# Patient Record
Sex: Female | Born: 1975
Health system: Southern US, Community
[De-identification: ages and names within clinical notes are randomized; demographics above are authoritative.]

## PROBLEM LIST (undated history)

## (undated) DIAGNOSIS — F419 Anxiety disorder, unspecified: Secondary | ICD-10-CM

## (undated) DIAGNOSIS — F32A Depression, unspecified: Secondary | ICD-10-CM

## (undated) HISTORY — PX: ACHILLES TENDON SURGERY: SHX542

## (undated) HISTORY — DX: Depression, unspecified: F32.A

## (undated) HISTORY — DX: Anxiety disorder, unspecified: F41.9

---

## 1999-05-21 ENCOUNTER — Other Ambulatory Visit: Admission: RE | Admit: 1999-05-21 | Discharge: 1999-05-21 | Payer: Self-pay | Admitting: Family Medicine

## 2006-01-22 ENCOUNTER — Inpatient Hospital Stay (HOSPITAL_COMMUNITY): Admission: AD | Admit: 2006-01-22 | Discharge: 2006-01-22 | Payer: Self-pay | Admitting: Obstetrics and Gynecology

## 2006-02-22 ENCOUNTER — Inpatient Hospital Stay (HOSPITAL_COMMUNITY): Admission: AD | Admit: 2006-02-22 | Discharge: 2006-02-25 | Payer: Self-pay | Admitting: *Deleted

## 2013-02-27 ENCOUNTER — Emergency Department: Payer: Self-pay | Admitting: Emergency Medicine

## 2014-05-13 IMAGING — CR DG ELBOW COMPLETE 3+V*L*
1 series · 4 of 4 positions shown · non-contrast
Comparison: None.

CLINICAL DATA: Left elbow injury and pain.

EXAM:
LEFT ELBOW - COMPLETE 3+ VIEW

[Series 1: x elbow lat left · 0.14mm/px · 4 of 4 slices shown]
[im 1/4]
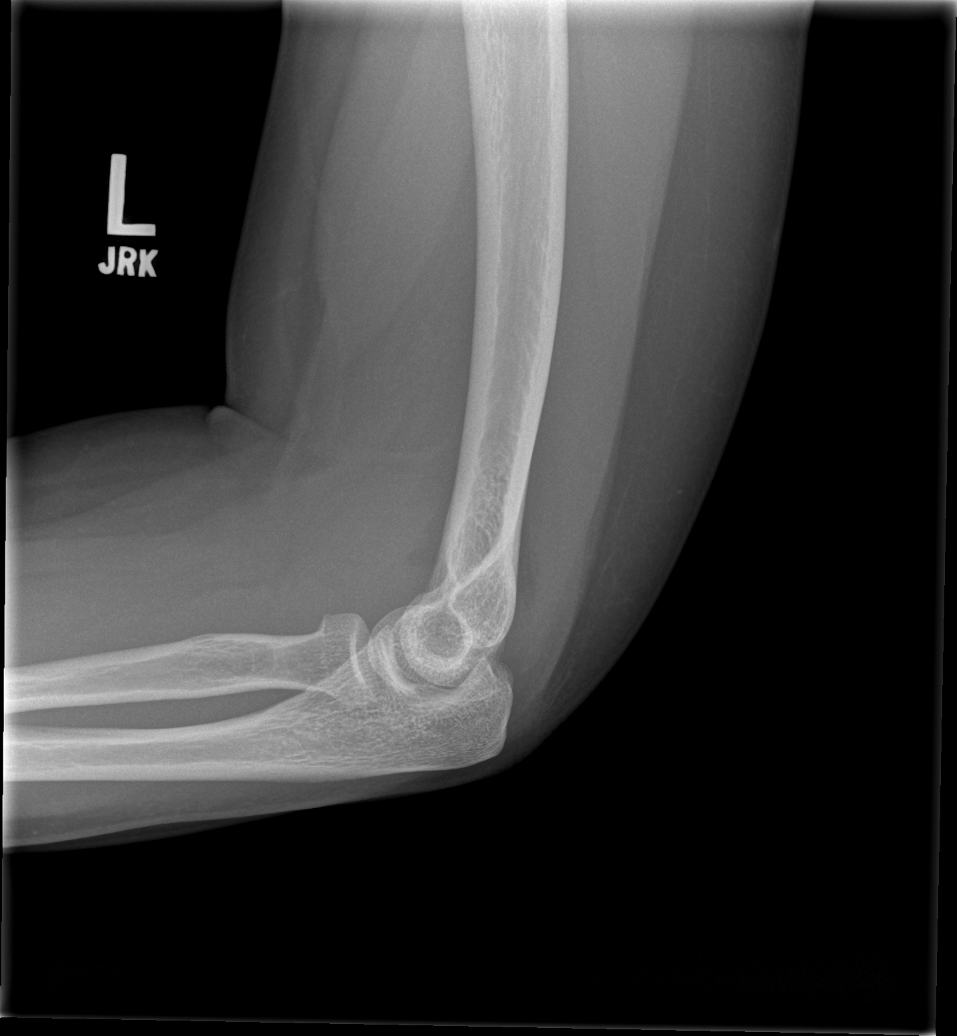
[im 2/4]
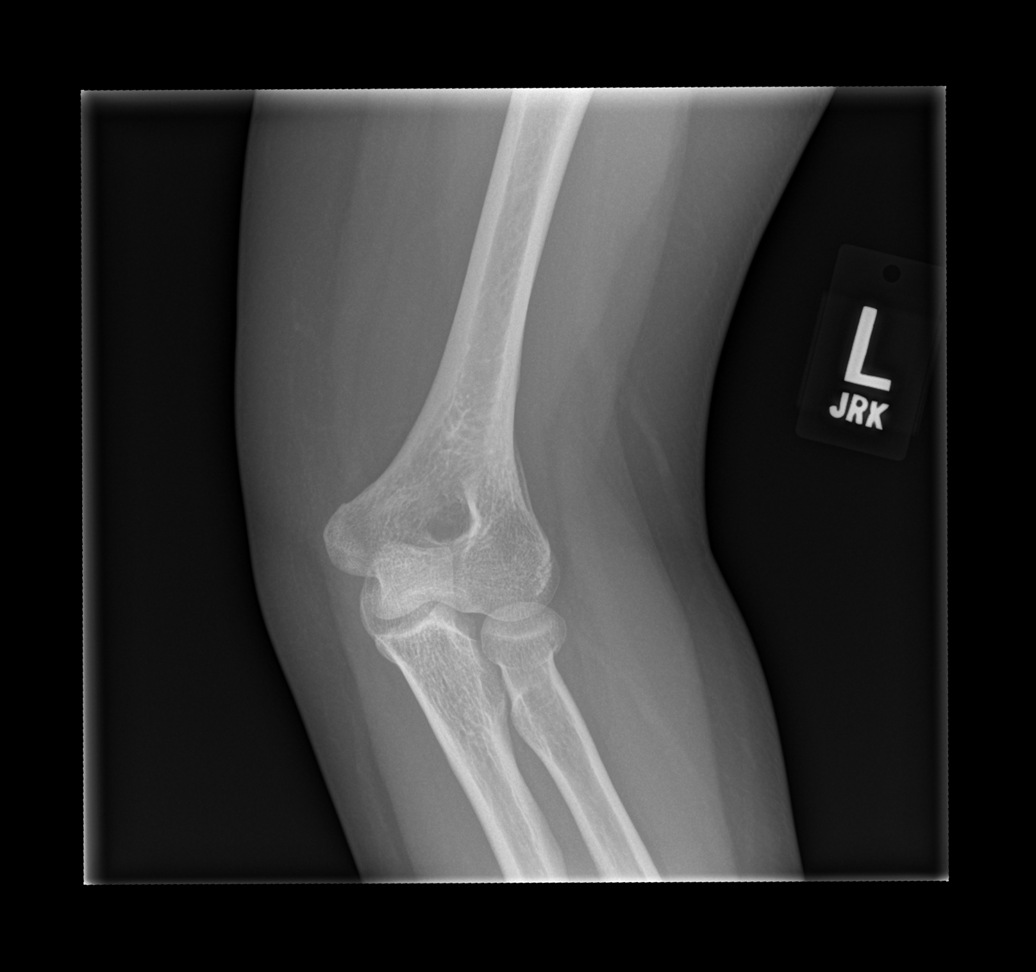
[im 3/4]
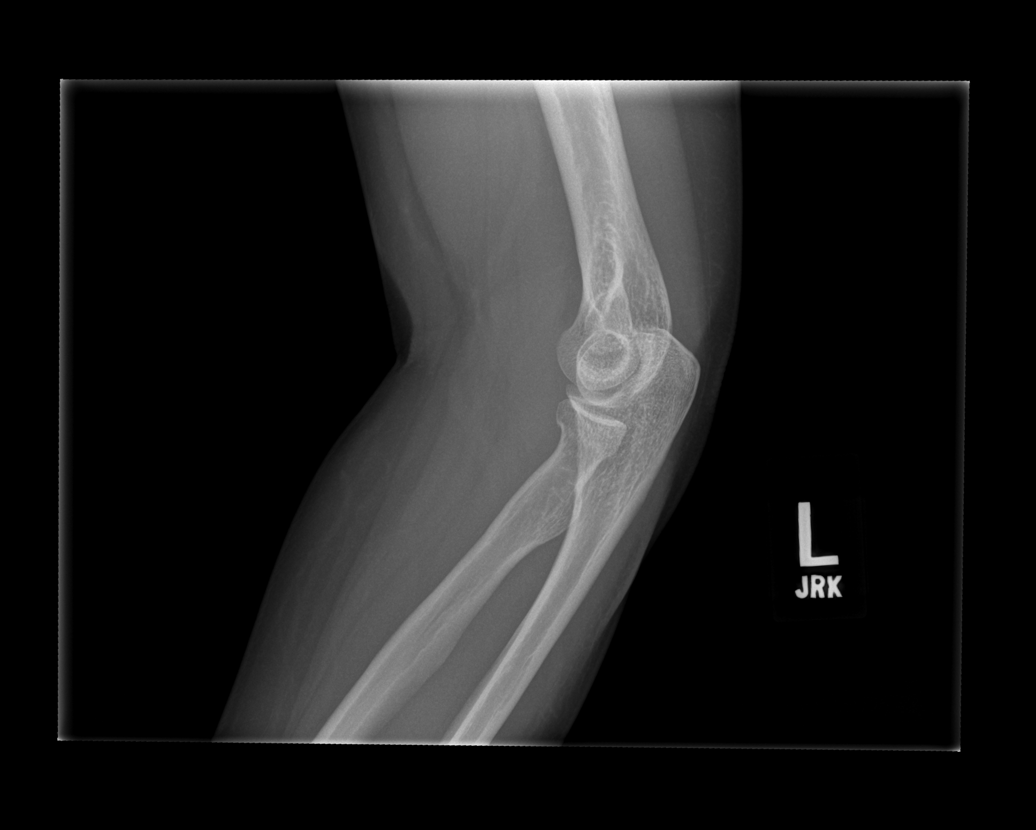
[im 4/4]
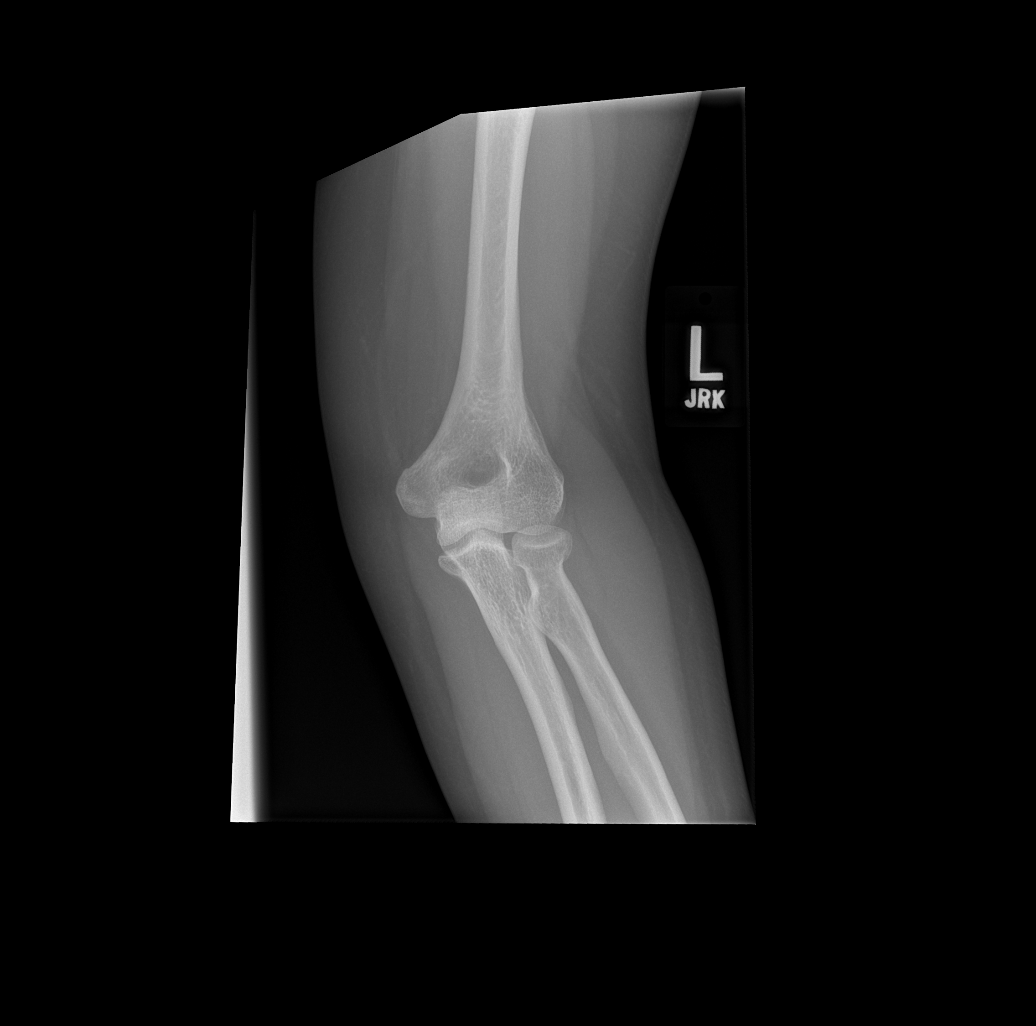

[4 of 4 positions shown; findings below may reference images not displayed]

FINDINGS: A nondisplaced radial head fracture is noted with a joint effusion.

There is no evidence of subluxation or dislocation.

No other bony abnormalities are identified.
IMPRESSION: Nondisplaced radial head fracture with joint effusion.

## 2016-01-26 ENCOUNTER — Ambulatory Visit (INDEPENDENT_AMBULATORY_CARE_PROVIDER_SITE_OTHER): Payer: BLUE CROSS/BLUE SHIELD | Admitting: Sports Medicine

## 2016-01-26 ENCOUNTER — Encounter: Payer: Self-pay | Admitting: Sports Medicine

## 2016-01-26 ENCOUNTER — Ambulatory Visit (INDEPENDENT_AMBULATORY_CARE_PROVIDER_SITE_OTHER): Payer: BLUE CROSS/BLUE SHIELD

## 2016-01-26 DIAGNOSIS — M79671 Pain in right foot: Secondary | ICD-10-CM

## 2016-01-26 DIAGNOSIS — M7661 Achilles tendinitis, right leg: Secondary | ICD-10-CM | POA: Diagnosis not present

## 2016-01-26 MED ORDER — TRIAMCINOLONE ACETONIDE 10 MG/ML IJ SUSP: 10.0000 mg | Freq: Once | INTRAMUSCULAR | Status: AC

## 2016-01-26 MED ORDER — METHYLPREDNISOLONE 4 MG PO TBPK: ORAL_TABLET | ORAL | 0 refills | Status: DC

## 2016-01-26 NOTE — Patient Instructions (Signed)

## 2016-01-26 NOTE — Progress Notes (Signed)
Subjective: Costella HatcherRobin B Quashie is a 40 y.o. female patient who presents to office for evaluation of Right heel pain. Patient complains of progressive pain especially over the last 4 months in the Right foot at the Achilles. Patient has tried change in shoes, icing, and elevating with meloxicam with no complete relief in symptoms. Admits to post static pain especially in the mornings or after a period of sitting and goes to stand. Patient denies any other pedal complaints.   There are no active problems to display for this patient.   No current outpatient prescriptions on file prior to visit.   No current facility-administered medications on file prior to visit.     No Known Allergies  Objective:  General: Alert and oriented x3 in no acute distress  Dermatology: No open lesions bilateral lower extremities, no webspace macerations, no ecchymosis bilateral, all nails x 10 are well manicured.  Vascular: Dorsalis Pedis and Posterior Tibial pedal pulses 2 out of 4, Capillary Fill Time 3 seconds, + pedal hair growth bilateral, no edema bilateral lower extremities, Temperature gradient within normal limits.  Neurology: Michaell CowingGross sensation intact via light touch bilateral, Protective sensation intact with Phoebe PerchSemmes Weinstein Monofilament to all pedal sites, Position sense intact, vibratory intact bilateral, Deep tendon reflexes within normal limits bilateral, No babinski sign present bilateral. No Tinels sign bilateral.  Musculoskeletal: Mild tenderness with palpation at insertion of the Achilles on Right at the medial greater than central and lateral aspect, there is calcaneal exostosis with mild soft tissue present and decreased ankle rom with knee extending  vs flexed resembling gastroc equnius bilateral, The achilles tendon feels intact with no nodularity or palpable dell, Thompson sign negative, Subtalar and midtarsal joint range of motion is within normal limits, there is no 1st ray hypermobility or  forefoot deformity noted bilateral.   Gait: Antalgic gait with increased heel off right>left.   Xrays  Right Foot    Impression: Normal osseous mineralization. Joint spaces preserved. No fracture/dislocation/boney destruction. Calcaneal spur present. Kager's triangle intact with no obliteration. No soft tissue abnormalities or radiopaque foreign bodies.   Assessment and Plan: Problem List Items Addressed This Visit    None    Visit Diagnoses    Tendonitis, Achilles, right    -  Primary   Relevant Medications   methylPREDNISolone (MEDROL DOSEPAK) 4 MG TBPK tablet   triamcinolone acetonide (KENALOG) 10 MG/ML injection 10 mg   Right foot pain       Relevant Medications   methylPREDNISolone (MEDROL DOSEPAK) 4 MG TBPK tablet   Other Relevant Orders   DG Foot 2 Views Right     -Complete examination performed -Xrays reviewed -Discussed treatement optionsFor insertional Achilles tendinitis -After oral consent and aseptic prep, injected a mixture containing 1 ml of 2%  plain lidocaine, 1 ml 0.5% plain marcaine, 0.5 ml of kenalog 10 and 0.5 ml of dexamethasone phosphate into right medial heel at the Achilles insertion with care not to violate tendon without complication. Post-injection care discussed with patient.  -Dispensed heel lifts to protect the tendon to wear at all times when ambulating -Rx Medrol dose pack. Patient is to resume the meloxicam. She has once dose pack is completed. -Recommend gentle stretching as instructed -No improvement will consider MRI/PT/EPAT -Patient to return to office in 3 weeks or sooner if condition worsens.  Asencion Islamitorya Dosia Yodice, DPM

## 2016-02-16 ENCOUNTER — Ambulatory Visit: Payer: BLUE CROSS/BLUE SHIELD | Admitting: Sports Medicine

## 2016-03-08 ENCOUNTER — Ambulatory Visit: Payer: BLUE CROSS/BLUE SHIELD | Admitting: Sports Medicine

## 2016-10-28 ENCOUNTER — Telehealth: Payer: Self-pay | Admitting: *Deleted

## 2016-10-28 NOTE — Telephone Encounter (Signed)
Pt states [place], and would like an earlier appt, because she is in such pain. Routed message to schedulers.

## 2016-10-29 ENCOUNTER — Ambulatory Visit (INDEPENDENT_AMBULATORY_CARE_PROVIDER_SITE_OTHER): Payer: BLUE CROSS/BLUE SHIELD | Admitting: Sports Medicine

## 2016-10-29 ENCOUNTER — Encounter: Payer: Self-pay | Admitting: Sports Medicine

## 2016-10-29 VITALS — BP 134/83 | HR 78 | Resp 16

## 2016-10-29 DIAGNOSIS — M7661 Achilles tendinitis, right leg: Secondary | ICD-10-CM | POA: Diagnosis not present

## 2016-10-29 DIAGNOSIS — M79671 Pain in right foot: Secondary | ICD-10-CM

## 2016-10-29 MED ORDER — TRIAMCINOLONE ACETONIDE 10 MG/ML IJ SUSP: 10.0000 mg | Freq: Once | INTRAMUSCULAR | Status: AC

## 2016-10-29 MED ORDER — METHYLPREDNISOLONE 4 MG PO TBPK: ORAL_TABLET | ORAL | 0 refills | Status: DC

## 2016-10-29 NOTE — Progress Notes (Signed)
Subjective: Mercedes Kline is a 41 y.o. female patient who returns to office for follow-up evaluation of Right heel pain. Patient states that her heel pain has flared up again started about a month ago and states that has gradually gotten worse, has tried icing and elevating, but nothing seems to help and by the end day. Pain is 8 out of 10. Patient denies any other pedal complaints.   There are no active problems to display for this patient.   Current Outpatient Prescriptions on File Prior to Visit  Medication Sig Dispense Refill  . ALPRAZolam (XANAX PO) Take by mouth.    . escitalopram (LEXAPRO) 20 MG tablet Take 20 mg by mouth daily.    . meclizine (ANTIVERT) 25 MG tablet Take 25 mg by mouth 3 (three) times daily as needed for dizziness.    . medroxyPROGESTERone (DEPO-PROVERA) 150 MG/ML injection Inject 150 mg into the muscle every 3 (three) months.    . MedroxyPROGESTERone Acetate (DEPO-PROVERA IM) Inject into the muscle.    . meloxicam (MOBIC) 15 MG tablet Take 15 mg by mouth daily.    . traMADol (ULTRAM) 50 MG tablet Take by mouth every 6 (six) hours as needed.     Current Facility-Administered Medications on File Prior to Visit  Medication Dose Route Frequency Provider Last Rate Last Dose  . triamcinolone acetonide (KENALOG) 10 MG/ML injection 10 mg  10 mg Other Once Asencion IslamStover, Fillmore Bynum, DPM        No Known Allergies  Objective:  General: Alert and oriented x3 in no acute distress  Dermatology: No open lesions bilateral lower extremities, no webspace macerations, no ecchymosis bilateral, all nails x 10 are well manicured.  Vascular: Dorsalis Pedis and Posterior Tibial pedal pulses 2 out of 4, Capillary Fill Time 3 seconds, + pedal hair growth bilateral, no edema bilateral lower extremities, Temperature gradient within normal limits.  Neurology: Michaell CowingGross sensation intact via light touch bilateral, Protective sensation intact with Phoebe PerchSemmes Weinstein Monofilament to all pedal sites,  Position sense intact, vibratory intact bilateral, Deep tendon reflexes within normal limits bilateral, No babinski sign present bilateral. No Tinels sign bilateral.  Musculoskeletal: Mild tenderness with palpation at insertion of the Achilles on Right at the medial greater than central and lateral aspect, there is calcaneal exostosis with mild soft tissue present and decreased ankle rom with knee extending  vs flexed resembling gastroc equnius bilateral, The achilles tendon feels intact with no nodularity or palpable dell, Thompson sign negative, Subtalar and midtarsal joint range of motion is within normal limits, there is no 1st ray hypermobility or forefoot deformity noted bilateral.   Assessment and Plan: Problem List Items Addressed This Visit    None    Visit Diagnoses    Tendonitis, Achilles, right    -  Primary   Relevant Medications   methylPREDNISolone (MEDROL DOSEPAK) 4 MG TBPK tablet   triamcinolone acetonide (KENALOG) 10 MG/ML injection 10 mg   Right foot pain       Relevant Medications   methylPREDNISolone (MEDROL DOSEPAK) 4 MG TBPK tablet   triamcinolone acetonide (KENALOG) 10 MG/ML injection 10 mg     -Complete examination performed -Previous Xrays reviewed -Discussed treatement options for insertional Achilles tendinitis -After oral consent and aseptic prep, injected a mixture containing 1 ml of 2%  plain lidocaine, 1 ml 0.5% plain marcaine, 0.5 ml of kenalog 10 and 0.5 ml of dexamethasone phosphate into right medial heel at the Achilles insertion with care not to violate tendon without complication. Post-injection  care discussed with patient. Dispensed CAM boot for patient to wear for 1 week and asked patient to avoid strenuous activity; after 1 week Patient may return to good supportive tennis shoe with heel lift as dispense this visit -Rx Medrol dose pack. Patient is to resume the meloxicam. She has once dose pack is completed. -Recommend gentle stretching as instructed  with night splint and ice pack at end day -No improvement will consider MRI/PT/EPAT -No work for today. May resume normal work with CAM boot on tomorrow -Patient to return to office in 3 weeks or sooner if condition worsens.  Asencion Islamitorya Alma Muegge, DPM

## 2016-11-08 ENCOUNTER — Ambulatory Visit: Payer: BLUE CROSS/BLUE SHIELD | Admitting: Sports Medicine

## 2016-11-19 ENCOUNTER — Ambulatory Visit: Payer: BLUE CROSS/BLUE SHIELD | Admitting: Sports Medicine

## 2016-12-31 DIAGNOSIS — F419 Anxiety disorder, unspecified: Secondary | ICD-10-CM | POA: Insufficient documentation

## 2017-01-13 ENCOUNTER — Encounter: Payer: Self-pay | Admitting: Emergency Medicine

## 2017-01-13 ENCOUNTER — Emergency Department
Admission: EM | Admit: 2017-01-13 | Discharge: 2017-01-14 | Disposition: A | Discharge status: Home / Self Care | Payer: BLUE CROSS/BLUE SHIELD | Attending: Emergency Medicine | Admitting: Emergency Medicine

## 2017-01-13 DIAGNOSIS — Y939 Activity, unspecified: Secondary | ICD-10-CM | POA: Diagnosis not present

## 2017-01-13 DIAGNOSIS — Y929 Unspecified place or not applicable: Secondary | ICD-10-CM | POA: Diagnosis not present

## 2017-01-13 DIAGNOSIS — Z791 Long term (current) use of non-steroidal anti-inflammatories (NSAID): Secondary | ICD-10-CM | POA: Diagnosis not present

## 2017-01-13 DIAGNOSIS — S99922A Unspecified injury of left foot, initial encounter: Secondary | ICD-10-CM | POA: Diagnosis present

## 2017-01-13 DIAGNOSIS — Z23 Encounter for immunization: Secondary | ICD-10-CM | POA: Diagnosis not present

## 2017-01-13 DIAGNOSIS — W268XXA Contact with other sharp object(s), not elsewhere classified, initial encounter: Secondary | ICD-10-CM | POA: Insufficient documentation

## 2017-01-13 DIAGNOSIS — Z79899 Other long term (current) drug therapy: Secondary | ICD-10-CM | POA: Diagnosis not present

## 2017-01-13 DIAGNOSIS — Y999 Unspecified external cause status: Secondary | ICD-10-CM | POA: Insufficient documentation

## 2017-01-13 DIAGNOSIS — S91115A Laceration without foreign body of left lesser toe(s) without damage to nail, initial encounter: Secondary | ICD-10-CM | POA: Diagnosis not present

## 2017-01-13 NOTE — ED Triage Notes (Signed)
Pt reports she fell coming off of a ladder and lacerated her second toe on left foot. On assessment there is an approximate 1 inch laceration under the 2nd toe. Bleeding controled at this time.

## 2017-01-13 NOTE — ED Provider Notes (Signed)
[facility] Emergency Department Provider Note   ____________________________________________   I have reviewed the triage vital signs and the nursing notes.   HISTORY  Chief Complaint Laceration   History limited by: Not Limited   HPI Mercedes Kline is a 41 y.o. female who presents to the emergency department today because of laceration and pain.   LOCATION:left fourth toe DURATION:happened yesterday evening TIMING: constant since it happened CONTEXT: patient was on a ladder, when she was stepping off she missed a step and it cut her. She was wearing flip flops at the time. Also suffered minor cuts to the third toe.  ASSOCIATED SYMPTOMS: No other injuries  Per medical record review no tdap documented.  History reviewed. No pertinent past medical history.  There are no active problems to display for this patient.   History reviewed. No pertinent surgical history.  Prior to Admission medications   Medication Sig Start Date End Date Taking? Authorizing Provider  ALPRAZolam (XANAX PO) Take by mouth.    [provider]  escitalopram (LEXAPRO) 20 MG tablet Take 20 mg by mouth daily.    [provider]  meclizine (ANTIVERT) 25 MG tablet Take 25 mg by mouth 3 (three) times daily as needed for dizziness.    [provider]  medroxyPROGESTERone (DEPO-PROVERA) 150 MG/ML injection Inject 150 mg into the muscle every 3 (three) months.    [provider]  MedroxyPROGESTERone Acetate (DEPO-PROVERA IM) Inject into the muscle.    [provider]  meloxicam (MOBIC) 15 MG tablet Take 15 mg by mouth daily.    [provider]  methylPREDNISolone (MEDROL DOSEPAK) 4 MG TBPK tablet Take as instructed 10/29/16   Asencion Islam, DPM  traMADol (ULTRAM) 50 MG tablet Take by mouth every 6 (six) hours as needed.    [provider]    Allergies Patient has no known allergies.  History reviewed. No pertinent  family history.  Social History Social History  Substance Use Topics  . Smoking status: Never Smoker  . Smokeless tobacco: Never Used  . Alcohol use Not on file    Review of Systems Constitutional: No fever/chills Eyes: No visual changes. ENT: No sore throat. Cardiovascular: Denies chest pain. Respiratory: Denies shortness of breath. Gastrointestinal: No abdominal pain.  No nausea, no vomiting.  No diarrhea.   Genitourinary: Negative for dysuria. Musculoskeletal: Positive for toe pain. Skin: Positive to laceration to left toe Neurological: Negative for headaches, focal weakness or numbness.  ____________________________________________   PHYSICAL EXAM:  VITAL SIGNS: ED Triage Vitals  Enc Vitals Group     BP 01/13/17 2137 131/77     Pulse Rate 01/13/17 2137 88     Resp 01/13/17 2137 16     Temp 01/13/17 2137 98 F (36.7 C)     Temp Source 01/13/17 2137 Oral     SpO2 01/13/17 2137 98 %     Weight 01/13/17 2124 197 lb (89.4 kg)     Height 01/13/17 2124  (1.6 m)   Constitutional: Alert and oriented. Well appearing and in no distress. Eyes: Conjunctivae are normal.  ENT   Head: Normocephalic and atraumatic.   Nose: No congestion/rhinnorhea.   Mouth/Throat: Mucous membranes are moist.   Neck: No stridor. Hematological/Lymphatic/Immunilogical: No cervical lymphadenopathy. Cardiovascular: Normal rate, regular rhythm.  No murmurs, rubs, or gallops.  Respiratory: Normal respiratory effort without tachypnea nor retractions. Breath sounds are clear and equal bilaterally. No wheezes/rales/rhonchi. Gastrointestinal: Soft and non tender. No rebound. No guarding.  Genitourinary: Deferred Musculoskeletal: Normal range of motion in all extremities. No lower extremity edema. Neurologic:  Normal speech and language. No gross focal neurologic deficits are appreciated.  Skin:  Roughly 1.5 cm laceration to the plantar surface of the left 4th toe. Also very small  superficial laceration to the 3rd toe Psychiatric: Mood and affect are normal. Speech and behavior are normal. Patient exhibits appropriate insight and judgment.  ____________________________________________    LABS (pertinent positives/negatives)  None  ____________________________________________   EKG  None  ____________________________________________    RADIOLOGY  None  ____________________________________________   PROCEDURES  Procedures  LACERATION REPAIR Performed by: Phineas Semen Authorized by: Phineas Semen Consent: Verbal consent obtained. Risks and benefits: risks, benefits and alternatives were discussed Consent given by: patient Patient identity confirmed: provided demographic data Prepped and Draped in normal sterile fashion Wound explored  Laceration Location: left fourth toe  Laceration Length: 1.5 cm  No Foreign Bodies seen or palpated  Anesthesia: local infiltration  Local anesthetic: lidocaine 1% without epinephrine  Anesthetic total: 1 ml  Irrigation method: syringe Amount of cleaning: standard  Skin closure: 5-0 vicryl rapide  Number of sutures: 4  Technique: simple interrupted  Patient tolerance: Patient tolerated the procedure well with no immediate complications.  ____________________________________________   INITIAL IMPRESSION / ASSESSMENT AND PLAN / ED COURSE  Pertinent labs & imaging results that were available during my care of the patient were reviewed by me and considered in my medical decision making (see chart for details).  Patient presents with laceration to base of left fourth toe. Concern for osseous injury. X-ray however did not show any osseous involvement. Wound was repaired. Discussed return precautions with the patient. Tetanus was updated.  ____________________________________________   FINAL CLINICAL IMPRESSION(S) / ED DIAGNOSES  Final diagnoses:  Laceration of lesser toe of left foot  without foreign body present or damage to nail, initial encounter     Note: This dictation was prepared with Dragon dictation. Any transcriptional errors that result from this process are unintentional     Phineas Semen, MD 01/14/17 [phone]

## 2017-01-14 ENCOUNTER — Emergency Department: Payer: BLUE CROSS/BLUE SHIELD

## 2017-01-14 MED ORDER — LIDOCAINE HCL (PF) 1 % IJ SOLN
5.0000 mL | Freq: Once | INTRAMUSCULAR | Status: AC
  Administered 2017-01-14: 5 mL
  Filled 2017-01-14: qty 5

## 2017-01-14 MED ORDER — TETANUS-DIPHTH-ACELL PERTUSSIS 5-2.5-18.5 LF-MCG/0.5 IM SUSP
0.5000 mL | Freq: Once | INTRAMUSCULAR | Status: AC
  Administered 2017-01-14: 0.5 mL via INTRAMUSCULAR
  Filled 2017-01-14: qty 0.5

## 2017-01-14 NOTE — Discharge Instructions (Signed)
Please seek medical attention for any high fevers, chest pain, shortness of breath, change in behavior, persistent vomiting, bloody stool or any other new or concerning symptoms.  

## 2017-02-18 ENCOUNTER — Ambulatory Visit: Payer: BLUE CROSS/BLUE SHIELD | Admitting: Sports Medicine

## 2017-02-25 ENCOUNTER — Ambulatory Visit: Payer: BLUE CROSS/BLUE SHIELD | Admitting: Sports Medicine

## 2017-04-16 ENCOUNTER — Encounter: Payer: Self-pay | Admitting: Gastroenterology

## 2017-04-16 ENCOUNTER — Other Ambulatory Visit: Payer: Self-pay | Admitting: Physician Assistant

## 2017-04-16 DIAGNOSIS — Z139 Encounter for screening, unspecified: Secondary | ICD-10-CM

## 2017-05-02 DIAGNOSIS — R52 Pain, unspecified: Secondary | ICD-10-CM | POA: Diagnosis not present

## 2017-05-02 DIAGNOSIS — Z112 Encounter for screening for other bacterial diseases: Secondary | ICD-10-CM | POA: Diagnosis not present

## 2017-05-02 DIAGNOSIS — J3489 Other specified disorders of nose and nasal sinuses: Secondary | ICD-10-CM | POA: Diagnosis not present

## 2017-05-08 DIAGNOSIS — Z3009 Encounter for other general counseling and advice on contraception: Secondary | ICD-10-CM | POA: Diagnosis not present

## 2017-05-08 DIAGNOSIS — E669 Obesity, unspecified: Secondary | ICD-10-CM | POA: Diagnosis not present

## 2017-05-08 DIAGNOSIS — Z713 Dietary counseling and surveillance: Secondary | ICD-10-CM | POA: Diagnosis not present

## 2017-05-08 DIAGNOSIS — Z309 Encounter for contraceptive management, unspecified: Secondary | ICD-10-CM | POA: Diagnosis not present

## 2017-05-08 DIAGNOSIS — Z79899 Other long term (current) drug therapy: Secondary | ICD-10-CM | POA: Diagnosis not present

## 2017-05-08 DIAGNOSIS — Z6835 Body mass index (BMI) 35.0-35.9, adult: Secondary | ICD-10-CM | POA: Diagnosis not present

## 2017-05-08 DIAGNOSIS — F419 Anxiety disorder, unspecified: Secondary | ICD-10-CM | POA: Diagnosis not present

## 2017-05-22 ENCOUNTER — Encounter: Payer: Self-pay | Admitting: *Deleted

## 2017-05-26 ENCOUNTER — Ambulatory Visit: Payer: BLUE CROSS/BLUE SHIELD | Admitting: Gastroenterology

## 2017-05-26 ENCOUNTER — Ambulatory Visit: Payer: BLUE CROSS/BLUE SHIELD

## 2017-06-13 DIAGNOSIS — Z713 Dietary counseling and surveillance: Secondary | ICD-10-CM | POA: Diagnosis not present

## 2017-06-13 DIAGNOSIS — R030 Elevated blood-pressure reading, without diagnosis of hypertension: Secondary | ICD-10-CM | POA: Diagnosis not present

## 2017-06-26 DIAGNOSIS — R52 Pain, unspecified: Secondary | ICD-10-CM | POA: Diagnosis not present

## 2017-06-26 DIAGNOSIS — J020 Streptococcal pharyngitis: Secondary | ICD-10-CM | POA: Diagnosis not present

## 2017-06-26 DIAGNOSIS — J3489 Other specified disorders of nose and nasal sinuses: Secondary | ICD-10-CM | POA: Diagnosis not present

## 2017-07-24 DIAGNOSIS — Z309 Encounter for contraceptive management, unspecified: Secondary | ICD-10-CM | POA: Diagnosis not present

## 2017-08-14 ENCOUNTER — Encounter: Payer: Self-pay | Admitting: Sports Medicine

## 2017-08-14 ENCOUNTER — Ambulatory Visit (INDEPENDENT_AMBULATORY_CARE_PROVIDER_SITE_OTHER): Payer: BLUE CROSS/BLUE SHIELD

## 2017-08-14 ENCOUNTER — Other Ambulatory Visit: Payer: Self-pay | Admitting: Sports Medicine

## 2017-08-14 ENCOUNTER — Encounter: Payer: Self-pay | Admitting: *Deleted

## 2017-08-14 ENCOUNTER — Ambulatory Visit: Payer: BLUE CROSS/BLUE SHIELD | Admitting: Sports Medicine

## 2017-08-14 DIAGNOSIS — M7661 Achilles tendinitis, right leg: Secondary | ICD-10-CM

## 2017-08-14 DIAGNOSIS — M79671 Pain in right foot: Secondary | ICD-10-CM

## 2017-08-14 DIAGNOSIS — M7662 Achilles tendinitis, left leg: Secondary | ICD-10-CM

## 2017-08-14 DIAGNOSIS — M79672 Pain in left foot: Secondary | ICD-10-CM

## 2017-08-14 DIAGNOSIS — M7730 Calcaneal spur, unspecified foot: Secondary | ICD-10-CM

## 2017-08-14 MED ORDER — TRIAMCINOLONE ACETONIDE 10 MG/ML IJ SUSP: 10.0000 mg | Freq: Once | INTRAMUSCULAR | Status: AC

## 2017-08-14 MED ORDER — METHYLPREDNISOLONE 4 MG PO TBPK: ORAL_TABLET | ORAL | 0 refills | Status: DC

## 2017-08-14 MED ORDER — MELOXICAM 15 MG PO TABS: 15.0000 mg | ORAL_TABLET | Freq: Every day | ORAL | 0 refills | Status: DC

## 2017-08-14 NOTE — Progress Notes (Signed)
Subjective: Mercedes Kline is a 42 y.o. female patient who returns to office for new onset left heel pain and continued right heel pain that has flared up over the last month. Patient states that she has started an exercise routine that has started to cause her pain at the back of both heels.  Reports that she started using her night splint again which helps some and is using heel lifts which also help.  Patient states that when her flareup days were very bad on the left side she actually used her cam boot with some improvement.  Reports pain 8 out of 10 sharp in nature and has also tried Tylenol with a little relief.  Patient denies increase warmth, redness, clicking, popping, grinding or pulling sensations to the posterior aspect of the heels.  Patient denies any recent injury or trauma.  Patient denies any other pedal complaints.   There are no active problems to display for this patient.   Current Outpatient Medications on File Prior to Visit  Medication Sig Dispense Refill  . escitalopram (LEXAPRO) 20 MG tablet Take 20 mg by mouth daily.    . medroxyPROGESTERone (DEPO-PROVERA) 150 MG/ML injection Inject 150 mg into the muscle every 3 (three) months.     Current Facility-Administered Medications on File Prior to Visit  Medication Dose Route Frequency Provider Last Rate Last Dose  . triamcinolone acetonide (KENALOG) 10 MG/ML injection 10 mg  10 mg Other Once Asencion Islam, DPM      . triamcinolone acetonide (KENALOG) 10 MG/ML injection 10 mg  10 mg Other Once Asencion Islam, DPM        No Known Allergies  Objective:  General: Alert and oriented x3 in no acute distress  Dermatology: No open lesions bilateral lower extremities, no webspace macerations, no ecchymosis bilateral, all nails x 10 are well manicured.  Vascular: Dorsalis Pedis and Posterior Tibial pedal pulses 2 out of 4, Capillary Fill Time 3 seconds, + pedal hair growth bilateral, no edema bilateral lower extremities,  Temperature gradient within normal limits.  Neurology: Michaell Cowing sensation intact via light touch bilateral, Protective sensation intact with Phoebe Perch Monofilament to all pedal sites, Position sense intact, vibratory intact bilateral, Deep tendon reflexes within normal limits bilateral, No babinski sign present bilateral. No Tinels sign bilateral.  Musculoskeletal: Moderate tenderness with palpation at the insertion of the Achilles on the left and mild tenderness with palpation at insertion of the Achilles on Right at the medial greater than central and lateral aspect, there is calcaneal exostosis with mild soft tissue present and decreased ankle rom with knee extending  vs flexed resembling gastroc equnius bilateral, The achilles tendon feels intact with no nodularity or palpable dell, Thompson sign negative, Subtalar and midtarsal joint range of motion is within normal limits, there is no 1st ray hypermobility or forefoot deformity noted bilateral.   Assessment and Plan: Problem List Items Addressed This Visit    None    Visit Diagnoses    Achilles tendinitis, right leg    -  Primary   Relevant Medications   methylPREDNISolone (MEDROL DOSEPAK) 4 MG TBPK tablet   meloxicam (MOBIC) 15 MG tablet   triamcinolone acetonide (KENALOG) 10 MG/ML injection 10 mg (Start on 08/14/2017 10:15 AM)   Achilles tendinitis, left leg       Relevant Medications   methylPREDNISolone (MEDROL DOSEPAK) 4 MG TBPK tablet   meloxicam (MOBIC) 15 MG tablet   Other Relevant Orders   DG Foot Complete Right   DG  Foot Complete Left   Calcaneal spur, unspecified laterality       Heel pain, bilateral          -Complete examination performed -X-rays were obtained this visit revealing posterior heel spur with soft tissue inflammation at the posterior aspect of the heel with Kagers triangle intact no other acute findings. -Discussed treatement options for reoccurring insertional Achilles tendinitis now bilateral -After  oral consent and aseptic prep, injected a mixture containing 1 ml of 2%  plain lidocaine, 1 ml 0.5% plain marcaine, 0.5 ml of kenalog 10 and 0.5 ml of dexamethasone phosphate into right medial heel at the Achilles insertion with care not to violate tendon without complication. Post-injection care discussed with patient. -Dispensed heel lift to use to protect the Achilles tendon and advised to avoid strenuous activity postinjection -Rx Medrol dose pack and meloxicam to take as instructed -Recommend gentle stretching as instructed with night splint and ice pack at end day as previous -No improvement will consider MRI/PT/EPAT; brochure for EPAT given -No work for today. May resume normal work with heel lifts or CAM boot on tomorrow -Patient to return to office in 3-4 weeks or sooner if condition worsens.  Asencion Islam, DPM

## 2017-08-14 NOTE — Patient Instructions (Signed)

## 2017-09-17 ENCOUNTER — Ambulatory Visit: Payer: BLUE CROSS/BLUE SHIELD | Admitting: Sports Medicine

## 2017-09-26 ENCOUNTER — Encounter: Payer: Self-pay | Admitting: *Deleted

## 2017-09-26 ENCOUNTER — Ambulatory Visit: Payer: BLUE CROSS/BLUE SHIELD | Admitting: Sports Medicine

## 2017-09-26 ENCOUNTER — Encounter: Payer: Self-pay | Admitting: Sports Medicine

## 2017-09-26 DIAGNOSIS — M7662 Achilles tendinitis, left leg: Secondary | ICD-10-CM

## 2017-09-26 DIAGNOSIS — M7661 Achilles tendinitis, right leg: Secondary | ICD-10-CM

## 2017-09-26 DIAGNOSIS — M79672 Pain in left foot: Secondary | ICD-10-CM

## 2017-09-26 DIAGNOSIS — M7730 Calcaneal spur, unspecified foot: Secondary | ICD-10-CM

## 2017-09-26 DIAGNOSIS — M79671 Pain in right foot: Secondary | ICD-10-CM

## 2017-09-26 MED ORDER — MELOXICAM 15 MG PO TABS: 15.0000 mg | ORAL_TABLET | Freq: Every day | ORAL | 0 refills | Status: DC

## 2017-09-26 MED ORDER — TRIAMCINOLONE ACETONIDE 10 MG/ML IJ SUSP: 10.0000 mg | Freq: Once | INTRAMUSCULAR | Status: AC

## 2017-09-26 NOTE — Progress Notes (Signed)
Subjective: Mercedes Kline is a 42 y.o. female patient who returns to office f follow-up evaluation of bilateral heel pain.  Patient reports that the right is doing fine but the left seems about the same or may be a little worse states that on yesterday she had a flareup where she could not put her foot down and reports that it has improved today because she wore her Cam boot and she did her night splint.  Patient denies calf pain warmth redness swelling shortness of breath or chest pain.  There are no active problems to display for this patient.   Current Outpatient Medications on File Prior to Visit  Medication Sig Dispense Refill  . escitalopram (LEXAPRO) 20 MG tablet Take 20 mg by mouth daily.    . medroxyPROGESTERone (DEPO-PROVERA) 150 MG/ML injection Inject 150 mg into the muscle every 3 (three) months.    . meloxicam (MOBIC) 15 MG tablet Take 1 tablet (15 mg total) by mouth daily. 30 tablet 0  . methylPREDNISolone (MEDROL DOSEPAK) 4 MG TBPK tablet Take as instructed 21 tablet 0   Current Facility-Administered Medications on File Prior to Visit  Medication Dose Route Frequency Provider Last Rate Last Dose  . triamcinolone acetonide (KENALOG) 10 MG/ML injection 10 mg  10 mg Other Once Asencion IslamStover, Jahrel Borthwick, DPM      . triamcinolone acetonide (KENALOG) 10 MG/ML injection 10 mg  10 mg Other Once ParksStover, Hollace Michelli, DPM      . triamcinolone acetonide (KENALOG) 10 MG/ML injection 10 mg  10 mg Other Once Asencion IslamStover, Abbygale Lapid, DPM        No Known Allergies  Objective:  General: Alert and oriented x3 in no acute distress  Dermatology: No open lesions bilateral lower extremities, no webspace macerations, no ecchymosis bilateral, all nails x 10 are well manicured.  Vascular: Dorsalis Pedis and Posterior Tibial pedal pulses 2 out of 4, Capillary Fill Time 3 seconds, + pedal hair growth bilateral, no edema bilateral lower extremities, Temperature gradient within normal limits.  Neurology: Michaell CowingGross sensation  intact via light touch bilateral, Protective sensation intact with Semmes Weinstein Monofilament to all pedal sites, Position sense intact, vibratory intact bilateral, Deep tendon reflexes within normal limits bilateral, No babinski sign present bilateral. No Tinels sign bilateral.  Musculoskeletal: Moderate tenderness with palpation at the insertion of the Achilles on the left at lateral aspect, there is minimal pain to palpation of the Achilles insertion on right there is calcaneal exostosis with mild soft tissue present and decreased ankle rom with knee extending  vs flexed resembling gastroc equnius bilateral, The achilles tendon feels intact with no nodularity or palpable dell, Thompson sign negative, Subtalar and midtarsal joint range of motion is within normal limits, there is no 1st ray hypermobility or forefoot deformity noted bilateral.   Assessment and Plan: Problem List Items Addressed This Visit    None    Visit Diagnoses    Achilles tendinitis, left leg    -  Primary   Calcaneal spur, unspecified laterality       Achilles tendinitis, right leg       Heel pain, bilateral         -Complete examination performed -Discussed treatement options for ongoing insertional Achilles tendinitis/heel pain -After oral consent and aseptic prep, injected a mixture containing 1 ml of 2%  plain lidocaine, 1 ml 0.5% plain marcaine, 0.5 ml of kenalog 10 and 0.5 ml of dexamethasone phosphate into left lateral heel at the Achilles insertion with care not to violate  tendon without complication. Post-injection care discussed with patient. -Dispensed heel lifts bilateral to use to protect the Achilles tendon and advised to avoid strenuous activity postinjection -Refilled meloxicam to take as instructed -Recommend gentle stretching as instructed with night splint and ice pack at end day as previous -Prescribed physical therapy for additional treatment modalities and provided a work note to allow  accommodation with shoes for her foot pain -Patient to return to office in 4-6 weeks after starting physical therapy or sooner if condition worsens.  Asencion Islam, DPM

## 2017-09-30 DIAGNOSIS — M25671 Stiffness of right ankle, not elsewhere classified: Secondary | ICD-10-CM | POA: Diagnosis not present

## 2017-09-30 DIAGNOSIS — M25572 Pain in left ankle and joints of left foot: Secondary | ICD-10-CM | POA: Diagnosis not present

## 2017-09-30 DIAGNOSIS — M25571 Pain in right ankle and joints of right foot: Secondary | ICD-10-CM | POA: Diagnosis not present

## 2017-09-30 DIAGNOSIS — M25672 Stiffness of left ankle, not elsewhere classified: Secondary | ICD-10-CM | POA: Diagnosis not present

## 2017-10-03 DIAGNOSIS — M25672 Stiffness of left ankle, not elsewhere classified: Secondary | ICD-10-CM | POA: Diagnosis not present

## 2017-10-03 DIAGNOSIS — M25572 Pain in left ankle and joints of left foot: Secondary | ICD-10-CM | POA: Diagnosis not present

## 2017-10-03 DIAGNOSIS — M25571 Pain in right ankle and joints of right foot: Secondary | ICD-10-CM | POA: Diagnosis not present

## 2017-10-03 DIAGNOSIS — M25671 Stiffness of right ankle, not elsewhere classified: Secondary | ICD-10-CM | POA: Diagnosis not present

## 2017-10-08 DIAGNOSIS — M25671 Stiffness of right ankle, not elsewhere classified: Secondary | ICD-10-CM | POA: Diagnosis not present

## 2017-10-08 DIAGNOSIS — M25572 Pain in left ankle and joints of left foot: Secondary | ICD-10-CM | POA: Diagnosis not present

## 2017-10-08 DIAGNOSIS — M25571 Pain in right ankle and joints of right foot: Secondary | ICD-10-CM | POA: Diagnosis not present

## 2017-10-08 DIAGNOSIS — M25672 Stiffness of left ankle, not elsewhere classified: Secondary | ICD-10-CM | POA: Diagnosis not present

## 2017-10-10 DIAGNOSIS — Z309 Encounter for contraceptive management, unspecified: Secondary | ICD-10-CM | POA: Diagnosis not present

## 2017-10-10 DIAGNOSIS — Z3009 Encounter for other general counseling and advice on contraception: Secondary | ICD-10-CM | POA: Diagnosis not present

## 2017-10-13 DIAGNOSIS — M25672 Stiffness of left ankle, not elsewhere classified: Secondary | ICD-10-CM | POA: Diagnosis not present

## 2017-10-13 DIAGNOSIS — M25572 Pain in left ankle and joints of left foot: Secondary | ICD-10-CM | POA: Diagnosis not present

## 2017-10-13 DIAGNOSIS — M25671 Stiffness of right ankle, not elsewhere classified: Secondary | ICD-10-CM | POA: Diagnosis not present

## 2017-10-13 DIAGNOSIS — M25571 Pain in right ankle and joints of right foot: Secondary | ICD-10-CM | POA: Diagnosis not present

## 2017-10-22 DIAGNOSIS — M25572 Pain in left ankle and joints of left foot: Secondary | ICD-10-CM | POA: Diagnosis not present

## 2017-10-22 DIAGNOSIS — M25571 Pain in right ankle and joints of right foot: Secondary | ICD-10-CM | POA: Diagnosis not present

## 2017-10-22 DIAGNOSIS — M25672 Stiffness of left ankle, not elsewhere classified: Secondary | ICD-10-CM | POA: Diagnosis not present

## 2017-10-22 DIAGNOSIS — M25671 Stiffness of right ankle, not elsewhere classified: Secondary | ICD-10-CM | POA: Diagnosis not present

## 2017-10-27 DIAGNOSIS — M25571 Pain in right ankle and joints of right foot: Secondary | ICD-10-CM | POA: Diagnosis not present

## 2017-10-27 DIAGNOSIS — M25572 Pain in left ankle and joints of left foot: Secondary | ICD-10-CM | POA: Diagnosis not present

## 2017-10-27 DIAGNOSIS — M25671 Stiffness of right ankle, not elsewhere classified: Secondary | ICD-10-CM | POA: Diagnosis not present

## 2017-10-27 DIAGNOSIS — M25672 Stiffness of left ankle, not elsewhere classified: Secondary | ICD-10-CM | POA: Diagnosis not present

## 2017-11-07 ENCOUNTER — Telehealth: Payer: Self-pay | Admitting: *Deleted

## 2017-11-07 ENCOUNTER — Ambulatory Visit (INDEPENDENT_AMBULATORY_CARE_PROVIDER_SITE_OTHER): Payer: BLUE CROSS/BLUE SHIELD | Admitting: Sports Medicine

## 2017-11-07 ENCOUNTER — Encounter: Payer: Self-pay | Admitting: Sports Medicine

## 2017-11-07 ENCOUNTER — Encounter: Payer: Self-pay | Admitting: *Deleted

## 2017-11-07 DIAGNOSIS — M7661 Achilles tendinitis, right leg: Secondary | ICD-10-CM

## 2017-11-07 DIAGNOSIS — M7730 Calcaneal spur, unspecified foot: Secondary | ICD-10-CM

## 2017-11-07 DIAGNOSIS — M79672 Pain in left foot: Secondary | ICD-10-CM

## 2017-11-07 DIAGNOSIS — M79671 Pain in right foot: Secondary | ICD-10-CM

## 2017-11-07 DIAGNOSIS — M7662 Achilles tendinitis, left leg: Secondary | ICD-10-CM

## 2017-11-07 MED ORDER — TRIAMCINOLONE ACETONIDE 10 MG/ML IJ SUSP: 10.0000 mg | Freq: Once | INTRAMUSCULAR | Status: AC

## 2017-11-07 MED ORDER — MELOXICAM 15 MG PO TABS: 15.0000 mg | ORAL_TABLET | Freq: Every day | ORAL | 0 refills | Status: DC

## 2017-11-07 NOTE — Addendum Note (Signed)
Addended by: Alphia Kava'CONNELL, VALERY D on: 11/07/2017 03:06 PM   Modules accepted: Orders

## 2017-11-07 NOTE — Progress Notes (Signed)
Subjective: Mercedes HatcherRobin B Kline is a 42 y.o. female patient who returns to office for follow-up evaluation of bilateral heel pain.  Patient reports that she has completed therapy and that it did help however still has flareups of pain states that in the afternoon when she is done with her day sometimes her heels are so painful that she is tearing up because of the pain.  Patient reports that her left heel hurts just as much as her right heel now states that it is hard for her to walk she even has to do stretches anytime that she has sat down and has continued with her heel lifts night splint and completed her anti-inflammatory with no complete relief.  Patient states that she wants to consider surgery for her Achilles tendinitis and heel spurs.  Patient denies redness, warmth, pain in her calf states that pain is all at the back of her heels at the bone spurs.  Patient denies any other symptoms at this time.  There are no active problems to display for this patient.   Current Outpatient Medications on File Prior to Visit  Medication Sig Dispense Refill  . escitalopram (LEXAPRO) 20 MG tablet Take 20 mg by mouth daily.    . medroxyPROGESTERone (DEPO-PROVERA) 150 MG/ML injection Inject 150 mg into the muscle every 3 (three) months.    . meloxicam (MOBIC) 15 MG tablet Take 1 tablet (15 mg total) by mouth daily. 30 tablet 0  . methylPREDNISolone (MEDROL DOSEPAK) 4 MG TBPK tablet Take as instructed 21 tablet 0   Current Facility-Administered Medications on File Prior to Visit  Medication Dose Route Frequency Provider Last Rate Last Dose  . triamcinolone acetonide (KENALOG) 10 MG/ML injection 10 mg  10 mg Other Once Asencion IslamStover, Anthonyjames Bargar, DPM      . triamcinolone acetonide (KENALOG) 10 MG/ML injection 10 mg  10 mg Other Once Asencion IslamStover, Nomi Rudnicki, DPM      . triamcinolone acetonide (KENALOG) 10 MG/ML injection 10 mg  10 mg Other Once BereaStover, Oather Muilenburg, DPM      . triamcinolone acetonide (KENALOG) 10 MG/ML injection 10 mg   10 mg Other Once Asencion IslamStover, Ramata Strothman, DPM        No Known Allergies  Objective:  General: Alert and oriented x3 in no acute distress  Dermatology: No open lesions bilateral lower extremities, no webspace macerations, no ecchymosis bilateral, all nails x 10 are well manicured.  Vascular: Dorsalis Pedis and Posterior Tibial pedal pulses 2 out of 4, Capillary Fill Time 3 seconds, + pedal hair growth bilateral, no edema bilateral lower extremities, Temperature gradient within normal limits.  Neurology: Michaell CowingGross sensation intact via light touch bilateral, Protective sensation intact with Phoebe PerchSemmes Weinstein Monofilament to all pedal sites, Position sense intact, vibratory intact bilateral, Deep tendon reflexes within normal limits bilateral, No babinski sign present bilateral. No Tinels sign bilateral.  Musculoskeletal: There is tenderness with palpation at the insertion of the Achilles on the left and right at lateral aspect,there is calcaneal exostosis with mild soft tissue present and decreased ankle rom with knee extending  vs flexed resembling gastroc equnius bilateral, The achilles tendon feels intact with no nodularity or palpable dell, Thompson sign negative, Subtalar and midtarsal joint range of motion is within normal limits, there is no 1st ray hypermobility or forefoot deformity noted bilateral.   Assessment and Plan: Problem List Items Addressed This Visit    None    Visit Diagnoses    Achilles tendinitis, left leg    -  Primary  Relevant Medications   meloxicam (MOBIC) 15 MG tablet   triamcinolone acetonide (KENALOG) 10 MG/ML injection 10 mg (Start on 11/07/2017  9:30 AM)   Achilles tendinitis, right leg       Relevant Medications   meloxicam (MOBIC) 15 MG tablet   triamcinolone acetonide (KENALOG) 10 MG/ML injection 10 mg (Start on 11/07/2017  9:30 AM)   Heel pain, bilateral       Calcaneal spur, unspecified laterality         -Complete examination performed -Discussed treatement  options for ongoing insertional Achilles tendinitis/heel pain -Continue with exercises taught by PT -Continue with night splint icing and refill meloxicam -Ordered MRIs for further evaluation of the Achilles tendon in preparation for surgical planning -After oral consent and aseptic prep, injected a mixture containing 1 ml of 2%  plain lidocaine, 1 ml 0.5% plain marcaine, 0.5 ml of kenalog 10 and 0.5 ml of dexamethasone phosphate into left and right lateral heels at Achilles insertion with care and protection to not violate the tendon without complication. Post-injection care discussed with patient.  -Work note given for today and advised patient to go home to rest elevate and use heel lifts or cam boot to prevent any additional stress to her Achilles after injection -Patient to return to office after MRIs or sooner if condition worsens.  Asencion Islam, DPM

## 2017-11-07 NOTE — Telephone Encounter (Signed)
-----   Message from Asencion Islamitorya Stover, [place] Dakota[place]PM sent at 11/07/2017  8:36 AM EDT ----- Regarding: MRI Bil Ankles Pain at achilles insertions bilateral x years Need for surgical planning

## 2017-11-07 NOTE — Telephone Encounter (Signed)
Entered in error

## 2017-11-07 NOTE — Telephone Encounter (Signed)
Orders to J. Quintana, RN for pre-cert. 

## 2017-11-12 ENCOUNTER — Telehealth: Payer: Self-pay

## 2017-11-12 NOTE — Telephone Encounter (Signed)
Left voicemail informing patient of her upcoming appointment with [facility].  She is scheduled for this Saturday arrive time 7:45 AM.  I told her that if this appointment time did not work for her she can call [facility] to reschedule.

## 2017-11-15 ENCOUNTER — Encounter: Payer: Self-pay | Admitting: Sports Medicine

## 2017-11-15 DIAGNOSIS — R600 Localized edema: Secondary | ICD-10-CM | POA: Diagnosis not present

## 2017-11-15 DIAGNOSIS — M7662 Achilles tendinitis, left leg: Secondary | ICD-10-CM | POA: Diagnosis not present

## 2017-11-15 DIAGNOSIS — S86312A Strain of muscle(s) and tendon(s) of peroneal muscle group at lower leg level, left leg, initial encounter: Secondary | ICD-10-CM | POA: Diagnosis not present

## 2017-11-18 ENCOUNTER — Telehealth: Payer: Self-pay | Admitting: *Deleted

## 2017-11-18 NOTE — Telephone Encounter (Signed)
I will call her on Wednesday once I review the results -Dr. SKathie Rhodes

## 2017-11-18 NOTE — Telephone Encounter (Signed)
Patient called states she had her MRI on Saturday and was hoping you would call her with the results.

## 2017-11-19 ENCOUNTER — Telehealth: Payer: Self-pay | Admitting: Sports Medicine

## 2017-11-19 NOTE — Telephone Encounter (Signed)
Called patient and discussed with her her MRI results.  Discussed with patient that her left MRI shows that she has a split peroneal brevis tear and Achilles tendinosis with a small interstitial tear and on her right MRI patient has a small OCD with arthritis likely secondary to compensation with Achilles tendinosis and a small interstitial tear.  Explained to patient that her treatment options consist of considering custom brace for left, EPAT treatments bilateral or surgery and would recommend doing the left foot first.  Patient further asked if she decides to move forward with surgery on her left will I also address the heel spur that is present at her calcaneus.  I advised patient the proposed procedure for her left foot with the debridement of her left Achilles tendon with application of graft and repair of interstitial tear and removal of heel spur, repair of split peroneal brevis tear with graft and cast for approximately 4 weeks.  Patient is aware that she will have to take significant time off of work and anticipate a recovery.  Of 12 to 16 weeks and must be nonweightbearing for the initial month and may have to have physical therapy during her recovery..  Patient expressed understanding and states that she will think about these treatment options.  I encouraged patient if she wants to move forward with surgery to call office for appointment on next week for surgery consultation.  Patient expressed understanding and thanked me for my time.

## 2017-11-20 ENCOUNTER — Encounter: Payer: Self-pay | Admitting: Sports Medicine

## 2017-11-25 ENCOUNTER — Ambulatory Visit: Payer: BLUE CROSS/BLUE SHIELD | Admitting: Sports Medicine

## 2017-11-25 ENCOUNTER — Encounter: Payer: Self-pay | Admitting: Sports Medicine

## 2017-11-25 DIAGNOSIS — M7662 Achilles tendinitis, left leg: Secondary | ICD-10-CM

## 2017-11-25 DIAGNOSIS — M7730 Calcaneal spur, unspecified foot: Secondary | ICD-10-CM

## 2017-11-25 DIAGNOSIS — M79671 Pain in right foot: Secondary | ICD-10-CM | POA: Diagnosis not present

## 2017-11-25 DIAGNOSIS — T148XXA Other injury of unspecified body region, initial encounter: Secondary | ICD-10-CM

## 2017-11-25 DIAGNOSIS — M79672 Pain in left foot: Secondary | ICD-10-CM

## 2017-11-25 NOTE — Patient Instructions (Signed)
Pre-Operative Instructions  Congratulations, you have decided to take an important step towards improving your quality of life.  You can be assured that the doctors and staff at [facility] will be with you every step of the way.  Here are some important things you should know:  1. Plan to be at the surgery center/hospital at least 1 (one) hour prior to your scheduled time, unless otherwise directed by the surgical center/hospital staff.  You must have a responsible adult accompany you, remain during the surgery and drive you home.  Make sure you have directions to the surgical center/hospital to ensure you arrive on time. 2. If you are having surgery at [facility] or [facility] hospitals, you will need a copy of your medical history and physical form from your family physician within one month prior to the date of surgery. We will give you a form for your primary physician to complete.  3. We make every effort to accommodate the date you request for surgery.  However, there are times where surgery dates or times have to be moved.  We will contact you as soon as possible if a change in schedule is required.   4. No aspirin/ibuprofen for one week before surgery.  If you are on aspirin, any non-steroidal anti-inflammatory medications (Mobic, Aleve, Ibuprofen) should not be taken seven (7) days prior to your surgery.  You make take Tylenol for pain prior to surgery.  5. Medications - If you are taking daily heart and blood pressure medications, seizure, reflux, allergy, asthma, anxiety, pain or diabetes medications, make sure you notify the surgery center/hospital before the day of surgery so they can tell you which medications you should take or avoid the day of surgery. 6. No food or drink after midnight the night before surgery unless directed otherwise by surgical center/hospital staff. 7. No alcoholic beverages 24-hours prior to surgery.  No smoking 24-hours prior or 24-hours after  surgery. 8. Wear loose pants or shorts. They should be loose enough to fit over bandages, boots, and casts. 9. Don't wear slip-on shoes. Sneakers are preferred. 10. Bring your boot with you to the surgery center/hospital.  Also bring crutches or a walker if your physician has prescribed it for you.  If you do not have this equipment, it will be provided for you after surgery. 11. If you have not been contacted by the surgery center/hospital by the day before your surgery, call to confirm the date and time of your surgery. 12. Leave-time from work may vary depending on the type of surgery you have.  Appropriate arrangements should be made prior to surgery with your employer. 13. Prescriptions will be provided immediately following surgery by your doctor.  Fill these as soon as possible after surgery and take the medication as directed. Pain medications will not be refilled on weekends and must be approved by the doctor. 14. Remove nail polish on the operative foot and avoid getting pedicures prior to surgery. 15. Wash the night before surgery.  The night before surgery wash the foot and leg well with water and the antibacterial soap provided. Be sure to pay special attention to beneath the toenails and in between the toes.  Wash for at least three (3) minutes. Rinse thoroughly with water and dry well with a towel.  Perform this wash unless told not to do so by your physician.  Enclosed: 1 Ice pack (please put in freezer the night before surgery)   1 Hibiclens skin cleaner     Pre-op instructions  If you have any questions regarding the instructions, please do not hesitate to call our office.  [place]: [address], [place], [place] [address] -- [phone]  [place]: [address] [place], [place] [address] -- [phone]  [place]: [address]  [place], [place] [address] -- [phone]  [place]: [address], [address], [place], [place] [address] -- [phone]  Website:  [email or web address] 

## 2017-11-25 NOTE — Progress Notes (Addendum)
Subjective: Mercedes Kline is a 42 y.o. female patient who returns to office for follow-up evaluation of bilateral heel pain.  Patient reports that last injection only lasted for a few days and is here to discuss additional treatment options.  I have reviewed MRI results with patient over the telephone and patient presents today for surgery consult.  Patient denies any acute changes since last encounter.  Patient Active Problem List   Diagnosis Date Noted  . Anxiety 12/31/2016    Current Outpatient Medications on File Prior to Visit  Medication Sig Dispense Refill  . ALPRAZolam (XANAX) 0.5 MG tablet Xanax  0.5 mg daily as needed.    Marland Kitchen escitalopram (LEXAPRO) 20 MG tablet Take 20 mg by mouth daily.    . medroxyPROGESTERone (DEPO-PROVERA) 150 MG/ML injection Inject 150 mg into the muscle every 3 (three) months.    . meloxicam (MOBIC) 15 MG tablet Take 1 tablet (15 mg total) by mouth daily. 30 tablet 0  . methylPREDNISolone (MEDROL DOSEPAK) 4 MG TBPK tablet Take as instructed 21 tablet 0   Current Facility-Administered Medications on File Prior to Visit  Medication Dose Route Frequency Provider Last Rate Last Dose  . triamcinolone acetonide (KENALOG) 10 MG/ML injection 10 mg  10 mg Other Once Asencion Islam, DPM      . triamcinolone acetonide (KENALOG) 10 MG/ML injection 10 mg  10 mg Other Once Asencion Islam, DPM      . triamcinolone acetonide (KENALOG) 10 MG/ML injection 10 mg  10 mg Other Once Asencion Islam, DPM      . triamcinolone acetonide (KENALOG) 10 MG/ML injection 10 mg  10 mg Other Once Whitney, Cristen Bredeson, DPM      . triamcinolone acetonide (KENALOG) 10 MG/ML injection 10 mg  10 mg Other Once Asencion Islam, DPM        No Known Allergies   Social History   Socioeconomic History  . Marital status: Married    Spouse name: Not on file  . Number of children: Not on file  . Years of education: Not on file  . Highest education level: Not on file  Occupational History  .  Not on file  Social Needs  . Financial resource strain: Not on file  . Food insecurity:    Worry: Not on file    Inability: Not on file  . Transportation needs:    Medical: Not on file    Non-medical: Not on file  Tobacco Use  . Smoking status: Never Smoker  . Smokeless tobacco: Never Used  Substance and Sexual Activity  . Alcohol use: Not on file  . Drug use: Not on file  . Sexual activity: Not on file  Lifestyle  . Physical activity:    Days per week: Not on file    Minutes per session: Not on file  . Stress: Not on file  Relationships  . Social connections:    Talks on phone: Not on file    Gets together: Not on file    Attends religious service: Not on file    Active member of club or organization: Not on file    Attends meetings of clubs or organizations: Not on file    Relationship status: Not on file  Other Topics Concern  . Not on file  Social History Narrative  . Not on file   History reviewed. No pertinent surgical history.  Family History  Problem Relation Age of Onset  . Breast cancer Mother   . Colon cancer Father   .  Heart disease Father      Objective:  General: Alert and oriented x3 in no acute distress  Dermatology: No open lesions bilateral lower extremities, no webspace macerations, no ecchymosis bilateral, all nails x 10 are well manicured.  Vascular: Dorsalis Pedis and Posterior Tibial pedal pulses 2 out of 4, Capillary Fill Time 3 seconds, + pedal hair growth bilateral, no edema bilateral lower extremities, Temperature gradient within normal limits.  Neurology: Michaell CowingGross sensation intact via light touch bilateral, Protective sensation intact with Phoebe PerchSemmes Weinstein Monofilament to all pedal sites, Position sense intact, vibratory intact bilateral, Deep tendon reflexes within normal limits bilateral, No babinski sign present bilateral. No Tinels sign bilateral.  Musculoskeletal: There is tenderness with palpation at the insertion of the Achilles on  the left and right at lateral aspect,there is calcaneal exostosis with mild soft tissue present and decreased ankle rom with knee extending  vs flexed resembling gastroc equnius bilateral, The achilles tendon feels intact with no nodularity or palpable dell, Thompson sign negative, Subtalar and midtarsal joint range of motion is within normal limits, there is no 1st ray hypermobility or forefoot deformity noted bilateral.   Assessment and Plan: Problem List Items Addressed This Visit    None    Visit Diagnoses    Achilles tendinitis, left leg    -  Primary   Calcaneal spur, unspecified laterality       Tendon tear       Heel pain, bilateral       L>R     -Complete examination performed -Discussed treatement options for ongoing insertional Achilles tendinitis/heel pain with peroneal tendon split tear on the left -Patient opt for surgical management. Consent obtained for left Achilles tendon repair with removal of heel spur and peroneal tendon repair with graft.  Pre and Post op course explained. Risks, benefits, alternatives explained. No guarantees given or implied. Surgical booking slip submitted and provided patient with Surgical packet and info for [facility]. -To dispense crutches at surgical center and patient will be splinted to be nonweightbearing for estimated time of 4 weeks -FMLA paperwork completed for patient to be out of work for estimated timeframe of 12 weeks -Return to office after surgery Asencion Islamitorya Milanna Kozlov, DPM

## 2017-11-28 ENCOUNTER — Telehealth: Payer: Self-pay | Admitting: *Deleted

## 2017-11-28 NOTE — Telephone Encounter (Signed)
"  I am a little concerned.  I'm supposed to have surgery on September 9.  I called the surgery center and they said they don't have me scheduled for surgery on the 9th."  We have you scheduled.  I'll call the surgery center and find out what is going on.  "Okay, thank you."

## 2017-12-08 ENCOUNTER — Encounter: Payer: Self-pay | Admitting: Sports Medicine

## 2017-12-08 DIAGNOSIS — M25572 Pain in left ankle and joints of left foot: Secondary | ICD-10-CM | POA: Diagnosis not present

## 2017-12-08 DIAGNOSIS — M66362 Spontaneous rupture of flexor tendons, left lower leg: Secondary | ICD-10-CM | POA: Diagnosis not present

## 2017-12-08 DIAGNOSIS — Z01818 Encounter for other preprocedural examination: Secondary | ICD-10-CM | POA: Diagnosis not present

## 2017-12-08 DIAGNOSIS — M7732 Calcaneal spur, left foot: Secondary | ICD-10-CM | POA: Diagnosis not present

## 2017-12-08 DIAGNOSIS — M7662 Achilles tendinitis, left leg: Secondary | ICD-10-CM | POA: Diagnosis not present

## 2017-12-09 ENCOUNTER — Telehealth: Payer: Self-pay | Admitting: Sports Medicine

## 2017-12-09 NOTE — Telephone Encounter (Signed)
Post op check phone call made to patient. Patient reports that she is doing good. Denies any overnight issues. I reminded patient to continue with ice and elevation and to follow up as scheduled for post op care. -Dr. Marylene Land

## 2017-12-16 ENCOUNTER — Encounter: Payer: Self-pay | Admitting: Sports Medicine

## 2017-12-16 ENCOUNTER — Ambulatory Visit (INDEPENDENT_AMBULATORY_CARE_PROVIDER_SITE_OTHER): Payer: BLUE CROSS/BLUE SHIELD | Admitting: Sports Medicine

## 2017-12-16 ENCOUNTER — Ambulatory Visit (INDEPENDENT_AMBULATORY_CARE_PROVIDER_SITE_OTHER): Payer: BLUE CROSS/BLUE SHIELD

## 2017-12-16 DIAGNOSIS — M7730 Calcaneal spur, unspecified foot: Secondary | ICD-10-CM

## 2017-12-16 DIAGNOSIS — T148XXA Other injury of unspecified body region, initial encounter: Secondary | ICD-10-CM

## 2017-12-16 DIAGNOSIS — M79672 Pain in left foot: Secondary | ICD-10-CM

## 2017-12-16 DIAGNOSIS — M7732 Calcaneal spur, left foot: Secondary | ICD-10-CM | POA: Diagnosis not present

## 2017-12-16 DIAGNOSIS — M7662 Achilles tendinitis, left leg: Secondary | ICD-10-CM

## 2017-12-16 DIAGNOSIS — Z9889 Other specified postprocedural states: Secondary | ICD-10-CM

## 2017-12-16 MED ORDER — HYDROCODONE-ACETAMINOPHEN 10-325 MG PO TABS: 1.0000 | ORAL_TABLET | Freq: Four times a day (QID) | ORAL | 0 refills | Status: AC | PRN

## 2017-12-16 NOTE — Progress Notes (Signed)
Subjective: Mercedes Kline is a 42 y.o. female patient seen today in office for POV #1 (DOS 12/08/2017), S/P left Achilles tendon repair and peroneal tendon repair with removal of heel spur. Patient denies pain at surgical site, currently reports that after 4 days she remove her catheter and the pain has not been as bad as she would have expected states that she is having the hardest time with ambulating with her crutches, denies calf pain, denies headache, chest pain, shortness of breath, nausea, vomiting, fever, or chills. Patient states that she is mainly taking Ibuprofen and is using Norco as needed and has continued with aspirin to prevent against a blood clot. No other issues noted.   Patient Active Problem List   Diagnosis Date Noted  . Anxiety 12/31/2016    Current Outpatient Medications on File Prior to Visit  Medication Sig Dispense Refill  . ALPRAZolam (XANAX) 0.5 MG tablet Xanax  0.5 mg daily as needed.    Marland Kitchen. escitalopram (LEXAPRO) 20 MG tablet Take 20 mg by mouth daily.    . medroxyPROGESTERone (DEPO-PROVERA) 150 MG/ML injection Inject 150 mg into the muscle every 3 (three) months.    . meloxicam (MOBIC) 15 MG tablet Take 1 tablet (15 mg total) by mouth daily. 30 tablet 0  . methylPREDNISolone (MEDROL DOSEPAK) 4 MG TBPK tablet Take as instructed 21 tablet 0   Current Facility-Administered Medications on File Prior to Visit  Medication Dose Route Frequency Provider Last Rate Last Dose  . triamcinolone acetonide (KENALOG) 10 MG/ML injection 10 mg  10 mg Other Once Asencion IslamStover, Suheyla Mortellaro, DPM      . triamcinolone acetonide (KENALOG) 10 MG/ML injection 10 mg  10 mg Other Once Asencion IslamStover, Infinity Jeffords, DPM      . triamcinolone acetonide (KENALOG) 10 MG/ML injection 10 mg  10 mg Other Once Asencion IslamStover, Nolan Tuazon, DPM      . triamcinolone acetonide (KENALOG) 10 MG/ML injection 10 mg  10 mg Other Once Asencion IslamStover, Elsi Stelzer, DPM      . triamcinolone acetonide (KENALOG) 10 MG/ML injection 10 mg  10 mg Other Once Asencion IslamStover,  Chane Magner, DPM        No Known Allergies  Objective: There were no vitals filed for this visit.  General: No acute distress, AAOx3  Left foot: Staples and sutures intact with no gapping or dehiscence at surgical site, mild swelling to left posterior heel, no erythema, no warmth, no drainage, no signs of infection noted, Capillary fill time <3 seconds in all digits, gross sensation present via light touch to left foot.  No pain or crepitation with range of motion left foot.  No pain with calf compression.   Post Op Xray, left foot.  Partial ostectomy site healing. Hardware intact radiolucent on x-ray. Soft tissue swelling within normal limits for post op status.   Assessment and Plan:  Problem List Items Addressed This Visit    None    Visit Diagnoses    Calcaneal spur, unspecified laterality    -  Primary   Relevant Orders   DG Foot Complete Left (Completed)   DME Other see comment   Achilles tendinitis, left leg       Relevant Orders   DME Other see comment   Tendon tear       Relevant Orders   DME Other see comment   Pain of left heel       Relevant Orders   DME Other see comment   Post-operative state       Relevant  Medications   HYDROcodone-acetaminophen (NORCO) 10-325 MG tablet   Other Relevant Orders   DME Other see comment       -Patient seen and evaluated -X-rays reviewed -Applied dry sterile dressing to surgical site left foot and posterior splint secured with ACE wrap and stockinet  -Advised patient to make sure to keep dressings clean, dry, and intact to left surgical site, adjusting the ACE as needed  -Advised patient to continue with crutches and nonweightbearing to left foot -Advised patient to limit activity to necessity  -Advised patient to ice and elevate as necessary  -Continue with aspirin for DVT prophylaxis -Refill Norco to take as needed for severe pain and advised to continue with Motrin to assist with pain and swelling -Will plan for possible cast  application and advised patient that staples will stay in a total of 3 weeks before they are removed. In the meantime, patient to call office if any issues or problems arise.   Asencion Islam, DPM

## 2017-12-19 ENCOUNTER — Telehealth: Payer: Self-pay | Admitting: Sports Medicine

## 2017-12-19 DIAGNOSIS — Z9889 Other specified postprocedural states: Secondary | ICD-10-CM

## 2017-12-19 DIAGNOSIS — M7662 Achilles tendinitis, left leg: Secondary | ICD-10-CM

## 2017-12-19 NOTE — Telephone Encounter (Signed)
Pt is waiting on referral for knee scooter per Dr. Marylene LandStover on Tuesday 12/16/2017. Please call patient back

## 2017-12-19 NOTE — Addendum Note (Signed)
Addended by: Alphia Kava'CONNELL, VALERY D on: 12/19/2017 11:18 AM   Modules accepted: Orders

## 2017-12-19 NOTE — Telephone Encounter (Signed)
Dr. Marylene LandStover okayed knee scooter. Orders faxed to [facility] and emailed to A. Baron Hamperrotter.

## 2017-12-19 NOTE — Telephone Encounter (Signed)
I informed pt the order for the knee scooter had been place 12/16/2017 originally and I would reorder.

## 2017-12-22 ENCOUNTER — Telehealth: Payer: Self-pay | Admitting: Sports Medicine

## 2017-12-22 NOTE — Telephone Encounter (Signed)
I had surgery two weeks ago with Dr. Marylene LandStover. I wanted to know if it was acceptable for me to get out every now and then with the assistance of my husband and a wheelchair. If you could call me back at [phone]. Thank you.

## 2017-12-22 NOTE — Telephone Encounter (Signed)
No she should not be out and about. I will tell her when she comes to office tomorrow. She still has staples and the incision is still healing. Being out and about may put her at risk of complications. -Dr. Marylene LandStover

## 2017-12-23 ENCOUNTER — Encounter: Payer: Self-pay | Admitting: Sports Medicine

## 2017-12-23 ENCOUNTER — Ambulatory Visit (INDEPENDENT_AMBULATORY_CARE_PROVIDER_SITE_OTHER): Payer: BLUE CROSS/BLUE SHIELD | Admitting: Sports Medicine

## 2017-12-23 VITALS — BP 151/88 | HR 71 | Resp 16

## 2017-12-23 DIAGNOSIS — M7662 Achilles tendinitis, left leg: Secondary | ICD-10-CM | POA: Diagnosis not present

## 2017-12-23 DIAGNOSIS — T148XXA Other injury of unspecified body region, initial encounter: Secondary | ICD-10-CM

## 2017-12-23 DIAGNOSIS — M7730 Calcaneal spur, unspecified foot: Secondary | ICD-10-CM | POA: Diagnosis not present

## 2017-12-23 DIAGNOSIS — Z9889 Other specified postprocedural states: Secondary | ICD-10-CM

## 2017-12-23 DIAGNOSIS — M79672 Pain in left foot: Secondary | ICD-10-CM | POA: Diagnosis not present

## 2017-12-23 MED ORDER — HYDROCODONE-ACETAMINOPHEN 10-325 MG PO TABS: 1.0000 | ORAL_TABLET | Freq: Three times a day (TID) | ORAL | 0 refills | Status: DC | PRN

## 2017-12-23 NOTE — Progress Notes (Signed)
Subjective: Mercedes Kline is a 42 y.o. female patient seen today in office for POV #2 (DOS 12/08/2017), S/P left Achilles tendon repair and peroneal tendon repair with removal of heel spur. Patient denies pain at surgical site, sometimes 1 out of 10, denies calf pain, denies headache, chest pain, shortness of breath, nausea, vomiting, fever, or chills. Patient states that she is mainly taking Ibuprofen every 8 hours and is using Norco as needed for any severe pain and has continued with aspirin to prevent against a blood clot. Patient is wanting to know when she can go out and about. No other issues noted.   Patient Active Problem List   Diagnosis Date Noted  . Anxiety 12/31/2016    Current Outpatient Medications on File Prior to Visit  Medication Sig Dispense Refill  . ALPRAZolam (XANAX) 0.5 MG tablet Xanax  0.5 mg daily as needed.    Marland Kitchen. escitalopram (LEXAPRO) 20 MG tablet Take 20 mg by mouth daily.    . medroxyPROGESTERone (DEPO-PROVERA) 150 MG/ML injection Inject 150 mg into the muscle every 3 (three) months.    . meloxicam (MOBIC) 15 MG tablet Take 1 tablet (15 mg total) by mouth daily. 30 tablet 0  . methylPREDNISolone (MEDROL DOSEPAK) 4 MG TBPK tablet Take as instructed 21 tablet 0   Current Facility-Administered Medications on File Prior to Visit  Medication Dose Route Frequency Provider Last Rate Last Dose  . triamcinolone acetonide (KENALOG) 10 MG/ML injection 10 mg  10 mg Other Once Asencion IslamStover, Torin Whisner, DPM      . triamcinolone acetonide (KENALOG) 10 MG/ML injection 10 mg  10 mg Other Once Asencion IslamStover, Ndrew Creason, DPM      . triamcinolone acetonide (KENALOG) 10 MG/ML injection 10 mg  10 mg Other Once Asencion IslamStover, Vaani Morren, DPM      . triamcinolone acetonide (KENALOG) 10 MG/ML injection 10 mg  10 mg Other Once Asencion IslamStover, Lilymarie Scroggins, DPM      . triamcinolone acetonide (KENALOG) 10 MG/ML injection 10 mg  10 mg Other Once Asencion IslamStover, Zaydyn Havey, DPM        No Known Allergies  Objective: There were no vitals filed  for this visit.  General: No acute distress, AAOx3  Left foot: Staples and sutures intact with no gapping or dehiscence at surgical site, mild swelling to left posterior heel, no erythema, no warmth, no drainage, no signs of infection noted, Capillary fill time <3 seconds in all digits, gross sensation present via light touch to left foot.  No pain or crepitation with range of motion left foot.  No pain with calf compression.   Assessment and Plan:  Problem List Items Addressed This Visit    None    Visit Diagnoses    Achilles tendinitis, left leg    -  Primary   Post-operative state       Calcaneal spur, unspecified laterality       Tendon tear       Pain of left heel           -Patient seen and evaluated -Applied fiberglass cast below the knee in equinus on left to keep clean, dry, and intact  -Advised patient to continue with crutches and nonweightbearing to left foot -Advised patient to limit activity to necessity  -Advised patient to ice and elevate as necessary  -Continue with aspirin for DVT prophylaxis -Refilled Norco to take as needed for severe pain and advised to continue with Motrin to assist with pain and swelling -Will plan for cast removal and staple/suture  removal next visit and ordering PT. In the meantime, patient to call office if any issues or problems arise.   Asencion Islam, DPM

## 2017-12-26 DIAGNOSIS — Z309 Encounter for contraceptive management, unspecified: Secondary | ICD-10-CM | POA: Diagnosis not present

## 2017-12-26 DIAGNOSIS — Z3009 Encounter for other general counseling and advice on contraception: Secondary | ICD-10-CM | POA: Diagnosis not present

## 2017-12-29 DIAGNOSIS — M7730 Calcaneal spur, unspecified foot: Secondary | ICD-10-CM | POA: Diagnosis not present

## 2017-12-29 DIAGNOSIS — T148XXA Other injury of unspecified body region, initial encounter: Secondary | ICD-10-CM | POA: Diagnosis not present

## 2017-12-29 DIAGNOSIS — M7662 Achilles tendinitis, left leg: Secondary | ICD-10-CM | POA: Diagnosis not present

## 2018-01-06 ENCOUNTER — Encounter: Payer: Self-pay | Admitting: Sports Medicine

## 2018-01-06 ENCOUNTER — Ambulatory Visit (INDEPENDENT_AMBULATORY_CARE_PROVIDER_SITE_OTHER): Payer: BLUE CROSS/BLUE SHIELD | Admitting: Sports Medicine

## 2018-01-06 DIAGNOSIS — M7730 Calcaneal spur, unspecified foot: Secondary | ICD-10-CM

## 2018-01-06 DIAGNOSIS — M79672 Pain in left foot: Secondary | ICD-10-CM

## 2018-01-06 DIAGNOSIS — M7662 Achilles tendinitis, left leg: Secondary | ICD-10-CM

## 2018-01-06 DIAGNOSIS — Z9889 Other specified postprocedural states: Secondary | ICD-10-CM

## 2018-01-06 DIAGNOSIS — T148XXA Other injury of unspecified body region, initial encounter: Secondary | ICD-10-CM

## 2018-01-06 NOTE — Progress Notes (Signed)
Subjective: Mercedes Kline is a 42 y.o. female patient seen today in office for POV #3 (DOS 12/08/2017), S/P left Achilles tendon repair and peroneal tendon repair with removal of heel spur. Patient denies pain at surgical site, but did get nauseous during today's visit states that she took a pain pill today before coming without eating much in anticipation of having her sutures and staples removed, denies headache, chest pain, shortness of breath, fever, or chills.  Patient is assisted by her husband and son who also reports that she has been compliant with staying off her foot and has been taking her aspirin to prevent against blood clot and taking her ibuprofen to help with pain swelling inflammation.  Patient is wanting to know when she can return back to work. No other issues noted.   Patient Active Problem List   Diagnosis Date Noted  . Anxiety 12/31/2016    Current Outpatient Medications on File Prior to Visit  Medication Sig Dispense Refill  . ALPRAZolam (XANAX) 0.5 MG tablet Xanax  0.5 mg daily as needed.    Marland Kitchen escitalopram (LEXAPRO) 20 MG tablet Take 20 mg by mouth daily.    Marland Kitchen HYDROcodone-acetaminophen (NORCO) 10-325 MG tablet Take 1 tablet by mouth every 8 (eight) hours as needed. 20 tablet 0  . medroxyPROGESTERone (DEPO-PROVERA) 150 MG/ML injection Inject 150 mg into the muscle every 3 (three) months.    . meloxicam (MOBIC) 15 MG tablet Take 1 tablet (15 mg total) by mouth daily. 30 tablet 0  . methylPREDNISolone (MEDROL DOSEPAK) 4 MG TBPK tablet Take as instructed 21 tablet 0   Current Facility-Administered Medications on File Prior to Visit  Medication Dose Route Frequency Provider Last Rate Last Dose  . triamcinolone acetonide (KENALOG) 10 MG/ML injection 10 mg  10 mg Other Once Asencion Islam, DPM      . triamcinolone acetonide (KENALOG) 10 MG/ML injection 10 mg  10 mg Other Once Asencion Islam, DPM      . triamcinolone acetonide (KENALOG) 10 MG/ML injection 10 mg  10 mg Other  Once Asencion Islam, DPM      . triamcinolone acetonide (KENALOG) 10 MG/ML injection 10 mg  10 mg Other Once Asencion Islam, DPM      . triamcinolone acetonide (KENALOG) 10 MG/ML injection 10 mg  10 mg Other Once Asencion Islam, DPM        No Known Allergies  Objective: There were no vitals filed for this visit.  General: No acute distress, AAOx3  Left foot: Staples and sutures intact with no gapping or dehiscence at surgical site, mild swelling to left posterior heel, no erythema, no warmth, no drainage, no signs of infection noted, Capillary fill time <3 seconds in all digits, gross sensation present via light touch to left foot.  No pain or crepitation with range of motion left foot.  No pain with calf compression.   Assessment and Plan:  Problem List Items Addressed This Visit    None    Visit Diagnoses    Achilles tendinitis, left leg    -  Primary   Post-operative state       Calcaneal spur, unspecified laterality       Tendon tear       Pain of left heel           -Patient seen and evaluated -Fiberglass cast removed every other staple was removed at posterior heel incision site on the left there is a small area of gapping so left in some  of the staples and will plan to reassess this incision site at next visit -Cam boot was dispensed and advised patient to still continue with nonweightbearing on her left foot -Ordered physical therapy for strengthening and will plan within the next 2 weeks to allow patient to become weightbearing however at this time to protect the incision recommend no extensive massage to the surgical site until the staples are removed; advised patient to refrain from getting her surgical site wet -Advised patient to ice and elevate as necessary  -Continue with aspirin for DVT prophylaxis -May continue with Motrin to assist with pain and swelling as needed -Will plan for finishing staple removal and allowing patient to shower and next visit in the  meantime, patient to call office if any issues or problems arise.  Advised patient that we will refrain from work until she can at least weight-bear and until she has started therapy to see how much strength she gains.  Asencion Islam, DPM

## 2018-01-07 ENCOUNTER — Telehealth: Payer: Self-pay | Admitting: Sports Medicine

## 2018-01-07 ENCOUNTER — Telehealth: Payer: Self-pay | Admitting: *Deleted

## 2018-01-07 DIAGNOSIS — T148XXA Other injury of unspecified body region, initial encounter: Secondary | ICD-10-CM

## 2018-01-07 DIAGNOSIS — M79672 Pain in left foot: Secondary | ICD-10-CM

## 2018-01-07 DIAGNOSIS — M7730 Calcaneal spur, unspecified foot: Secondary | ICD-10-CM

## 2018-01-07 DIAGNOSIS — Z9889 Other specified postprocedural states: Secondary | ICD-10-CM

## 2018-01-07 DIAGNOSIS — M7662 Achilles tendinitis, left leg: Secondary | ICD-10-CM

## 2018-01-07 NOTE — Addendum Note (Signed)
Addended by: Alphia Kava D on: 01/07/2018 11:50 AM   Modules accepted: Orders

## 2018-01-07 NOTE — Telephone Encounter (Signed)
Delivered PT rx to [facility]. 

## 2018-01-07 NOTE — Telephone Encounter (Signed)
I have a couple of questions from where I saw Dr. Marylene Land yesterday. She put me in a cam boot after coming out of the cast from my surgery. Do I need to sleep in the boot or if I could sleep in my sleeping splint instead? I wanted to know if she could contact [facility] and see how much a wheelchair with a raised something would be. My number is [phone]. Thank you.

## 2018-01-07 NOTE — Telephone Encounter (Signed)
I called pt, informed she would need to walk in the cam boot, but could sleep in the night splint, and I would send an order to [facility] for the wheelchair. Faxed order to [facility] and emailed to A. Baron Hamper.

## 2018-01-07 NOTE — Telephone Encounter (Signed)
-----   Message from Asencion Islam, [place] sent at 01/06/2018  5:53 PM EDT ----- Regarding: PT at [facility] S/p L achilles repair, removal of heel spur, and peroneal tendon repair PT to eval and treat May come out of boot for PT and for the next 2weeks to remain Nonweightbearing then progress to partial weightbearing and then to full over 2 week incremints each -Dr. Marylene Land

## 2018-01-07 NOTE — Progress Notes (Signed)
DOS: 12-08-2017 Left Achilles tendon repair with graft, removal of heel spur, and peroneal tendon repair with graft with application of cast/splint  Dr. Wilhemina Cash

## 2018-01-08 DIAGNOSIS — M25672 Stiffness of left ankle, not elsewhere classified: Secondary | ICD-10-CM | POA: Diagnosis not present

## 2018-01-08 DIAGNOSIS — M62572 Muscle wasting and atrophy, not elsewhere classified, left ankle and foot: Secondary | ICD-10-CM | POA: Diagnosis not present

## 2018-01-08 DIAGNOSIS — M62562 Muscle wasting and atrophy, not elsewhere classified, left lower leg: Secondary | ICD-10-CM | POA: Diagnosis not present

## 2018-01-08 DIAGNOSIS — R269 Unspecified abnormalities of gait and mobility: Secondary | ICD-10-CM | POA: Diagnosis not present

## 2018-01-13 ENCOUNTER — Encounter: Payer: Self-pay | Admitting: Sports Medicine

## 2018-01-13 ENCOUNTER — Ambulatory Visit (INDEPENDENT_AMBULATORY_CARE_PROVIDER_SITE_OTHER): Payer: BLUE CROSS/BLUE SHIELD | Admitting: Sports Medicine

## 2018-01-13 DIAGNOSIS — M7662 Achilles tendinitis, left leg: Secondary | ICD-10-CM

## 2018-01-13 DIAGNOSIS — M7730 Calcaneal spur, unspecified foot: Secondary | ICD-10-CM

## 2018-01-13 DIAGNOSIS — T148XXA Other injury of unspecified body region, initial encounter: Secondary | ICD-10-CM

## 2018-01-13 DIAGNOSIS — M79672 Pain in left foot: Secondary | ICD-10-CM

## 2018-01-13 DIAGNOSIS — Z9889 Other specified postprocedural states: Secondary | ICD-10-CM

## 2018-01-13 NOTE — Progress Notes (Signed)
Subjective: Mercedes Kline is a 42 y.o. female patient seen today in office for POV #4 (DOS 12/08/2017), S/P left Achilles tendon repair and peroneal tendon repair with removal of heel spur. Patient denies pain at surgical site, however sometimes occasionally takes Motrin and reports that she is doing good and has started physical therapy with some improvements and no significant pain however does want to discuss possibly going to work for few days starting on next week. No other issues noted.   Patient Active Problem List   Diagnosis Date Noted  . Anxiety 12/31/2016    Current Outpatient Medications on File Prior to Visit  Medication Sig Dispense Refill  . ALPRAZolam (XANAX) 0.5 MG tablet Xanax  0.5 mg daily as needed.    Marland Kitchen escitalopram (LEXAPRO) 20 MG tablet Take 20 mg by mouth daily.    Marland Kitchen HYDROcodone-acetaminophen (NORCO) 10-325 MG tablet Take 1 tablet by mouth every 8 (eight) hours as needed. 20 tablet 0  . medroxyPROGESTERone (DEPO-PROVERA) 150 MG/ML injection Inject 150 mg into the muscle every 3 (three) months.    . meloxicam (MOBIC) 15 MG tablet Take 1 tablet (15 mg total) by mouth daily. 30 tablet 0  . methylPREDNISolone (MEDROL DOSEPAK) 4 MG TBPK tablet Take as instructed 21 tablet 0   Current Facility-Administered Medications on File Prior to Visit  Medication Dose Route Frequency Provider Last Rate Last Dose  . triamcinolone acetonide (KENALOG) 10 MG/ML injection 10 mg  10 mg Other Once Asencion Islam, DPM      . triamcinolone acetonide (KENALOG) 10 MG/ML injection 10 mg  10 mg Other Once Asencion Islam, DPM      . triamcinolone acetonide (KENALOG) 10 MG/ML injection 10 mg  10 mg Other Once Asencion Islam, DPM      . triamcinolone acetonide (KENALOG) 10 MG/ML injection 10 mg  10 mg Other Once Asencion Islam, DPM      . triamcinolone acetonide (KENALOG) 10 MG/ML injection 10 mg  10 mg Other Once Asencion Islam, DPM        No Known Allergies  Objective: There were no  vitals filed for this visit.  General: No acute distress, AAOx3  Left foot: Staples intact with no gapping or dehiscence at surgical site, mild swelling to left posterior heel, no erythema, no warmth, no drainage, no signs of infection noted, Capillary fill time <3 seconds in all digits, gross sensation present via light touch to left foot.  No pain or crepitation with range of motion left foot.  No pain with calf compression.   Assessment and Plan:  Problem List Items Addressed This Visit    None    Visit Diagnoses    Achilles tendinitis, left leg    -  Primary   Post-operative state       Calcaneal spur, unspecified laterality       Tendon tear       Pain of left heel           -Patient seen and evaluated -Remaining staples removed applied Steri-Strips and a compressive sleeve dressing advised patient to continue for 1 additional week with nonweightbearing and then may slowly progress with physical therapy to partial weightbearing -Meanwhile patient to continue with knee scooter and advised patient that she may return to work as long as they allow her to use her knee scooter and to elevate her foot and work no more than 4 hours 3 to 4 days/week -Advised patient to ice as necessary  -Continue with aspirin  for DVT prophylaxis -May continue with Motrin to assist with pain and swelling as needed -Will plan for x-rays and follow-up to see how patient is doing with physical therapy and with returning to work at next office visit in 2 weeks. Asencion Islam, DPM

## 2018-01-15 DIAGNOSIS — M25672 Stiffness of left ankle, not elsewhere classified: Secondary | ICD-10-CM | POA: Diagnosis not present

## 2018-01-15 DIAGNOSIS — M62562 Muscle wasting and atrophy, not elsewhere classified, left lower leg: Secondary | ICD-10-CM | POA: Diagnosis not present

## 2018-01-15 DIAGNOSIS — M62572 Muscle wasting and atrophy, not elsewhere classified, left ankle and foot: Secondary | ICD-10-CM | POA: Diagnosis not present

## 2018-01-15 DIAGNOSIS — R269 Unspecified abnormalities of gait and mobility: Secondary | ICD-10-CM | POA: Diagnosis not present

## 2018-01-20 DIAGNOSIS — M62572 Muscle wasting and atrophy, not elsewhere classified, left ankle and foot: Secondary | ICD-10-CM | POA: Diagnosis not present

## 2018-01-20 DIAGNOSIS — R269 Unspecified abnormalities of gait and mobility: Secondary | ICD-10-CM | POA: Diagnosis not present

## 2018-01-20 DIAGNOSIS — M62562 Muscle wasting and atrophy, not elsewhere classified, left lower leg: Secondary | ICD-10-CM | POA: Diagnosis not present

## 2018-01-20 DIAGNOSIS — M25672 Stiffness of left ankle, not elsewhere classified: Secondary | ICD-10-CM | POA: Diagnosis not present

## 2018-01-22 DIAGNOSIS — M62572 Muscle wasting and atrophy, not elsewhere classified, left ankle and foot: Secondary | ICD-10-CM | POA: Diagnosis not present

## 2018-01-22 DIAGNOSIS — R269 Unspecified abnormalities of gait and mobility: Secondary | ICD-10-CM | POA: Diagnosis not present

## 2018-01-22 DIAGNOSIS — M62562 Muscle wasting and atrophy, not elsewhere classified, left lower leg: Secondary | ICD-10-CM | POA: Diagnosis not present

## 2018-01-22 DIAGNOSIS — M25672 Stiffness of left ankle, not elsewhere classified: Secondary | ICD-10-CM | POA: Diagnosis not present

## 2018-01-27 ENCOUNTER — Ambulatory Visit (INDEPENDENT_AMBULATORY_CARE_PROVIDER_SITE_OTHER): Payer: BLUE CROSS/BLUE SHIELD | Admitting: Sports Medicine

## 2018-01-27 ENCOUNTER — Encounter: Payer: Self-pay | Admitting: Sports Medicine

## 2018-01-27 ENCOUNTER — Ambulatory Visit (INDEPENDENT_AMBULATORY_CARE_PROVIDER_SITE_OTHER): Payer: BLUE CROSS/BLUE SHIELD

## 2018-01-27 DIAGNOSIS — M25672 Stiffness of left ankle, not elsewhere classified: Secondary | ICD-10-CM | POA: Diagnosis not present

## 2018-01-27 DIAGNOSIS — R269 Unspecified abnormalities of gait and mobility: Secondary | ICD-10-CM | POA: Diagnosis not present

## 2018-01-27 DIAGNOSIS — M79672 Pain in left foot: Secondary | ICD-10-CM

## 2018-01-27 DIAGNOSIS — Z9889 Other specified postprocedural states: Secondary | ICD-10-CM

## 2018-01-27 DIAGNOSIS — M62572 Muscle wasting and atrophy, not elsewhere classified, left ankle and foot: Secondary | ICD-10-CM | POA: Diagnosis not present

## 2018-01-27 DIAGNOSIS — M62562 Muscle wasting and atrophy, not elsewhere classified, left lower leg: Secondary | ICD-10-CM | POA: Diagnosis not present

## 2018-01-27 DIAGNOSIS — T148XXA Other injury of unspecified body region, initial encounter: Secondary | ICD-10-CM

## 2018-01-27 DIAGNOSIS — M7732 Calcaneal spur, left foot: Secondary | ICD-10-CM

## 2018-01-27 DIAGNOSIS — M7730 Calcaneal spur, unspecified foot: Secondary | ICD-10-CM

## 2018-01-27 NOTE — Progress Notes (Signed)
Subjective: Mercedes Kline is a 42 y.o. female patient seen today in office for POV #5 (DOS 12/08/2017), S/P left Achilles tendon repair and peroneal tendon repair with removal of heel spur. Patient admits a little pain at the bottom of her heel 3-4 out of 10 and admits to some swelling now that she is back at work states that it was difficult her first week to adjust but now is doing well. No other issues noted.   Patient Active Problem List   Diagnosis Date Noted  . Anxiety 12/31/2016    Current Outpatient Medications on File Prior to Visit  Medication Sig Dispense Refill  . ALPRAZolam (XANAX) 0.5 MG tablet Xanax  0.5 mg daily as needed.    Marland Kitchen escitalopram (LEXAPRO) 20 MG tablet Take 20 mg by mouth daily.    Marland Kitchen HYDROcodone-acetaminophen (NORCO) 10-325 MG tablet Take 1 tablet by mouth every 8 (eight) hours as needed. 20 tablet 0  . medroxyPROGESTERone (DEPO-PROVERA) 150 MG/ML injection Inject 150 mg into the muscle every 3 (three) months.    . meloxicam (MOBIC) 15 MG tablet Take 1 tablet (15 mg total) by mouth daily. 30 tablet 0  . methylPREDNISolone (MEDROL DOSEPAK) 4 MG TBPK tablet Take as instructed 21 tablet 0   Current Facility-Administered Medications on File Prior to Visit  Medication Dose Route Frequency Provider Last Rate Last Dose  . triamcinolone acetonide (KENALOG) 10 MG/ML injection 10 mg  10 mg Other Once Asencion Islam, DPM      . triamcinolone acetonide (KENALOG) 10 MG/ML injection 10 mg  10 mg Other Once Asencion Islam, DPM      . triamcinolone acetonide (KENALOG) 10 MG/ML injection 10 mg  10 mg Other Once Asencion Islam, DPM      . triamcinolone acetonide (KENALOG) 10 MG/ML injection 10 mg  10 mg Other Once Asencion Islam, DPM      . triamcinolone acetonide (KENALOG) 10 MG/ML injection 10 mg  10 mg Other Once Asencion Islam, DPM        No Known Allergies  Objective: There were no vitals filed for this visit.  General: No acute distress, AAOx3  Left foot:  Incision healing well at surgical site, mild swelling to left posterior heel, no erythema, no warmth, no drainage, no signs of infection noted, Capillary fill time <3 seconds in all digits, gross sensation present via light touch to left foot.  No pain or crepitation with range of motion left foot.  No pain with calf compression.   Assessment and Plan:  Problem List Items Addressed This Visit    None    Visit Diagnoses    Post-operative state    -  Primary   Achilles tendinitis, left leg       Calcaneal spur, unspecified laterality       Tendon tear       Pain of left heel          -Patient seen and evaluated -Xrays reviewed which are consistent with postop status and unchanged arthritis of foot -May WB with CAM boot and cane for short distances and advised continue with Knee Scooter at work and for long distances as needed -Patient to elevate her foot and continue with work no more than 4 hours 3 to 4 days/week for the next 2 weeks -Advised patient to ice as necessary and to wear compression sleeve -May D/C aspirin  -May continue with Motrin to assist with pain and swelling as needed -Renew handicap placard for an additional  3 months -Will plan on weaning patient to post op shoe in 2 weeks.  Patient to return to office as scheduled or sooner if problems or issues arise.  Asencion Islam, DPM

## 2018-01-28 DIAGNOSIS — M7662 Achilles tendinitis, left leg: Secondary | ICD-10-CM | POA: Diagnosis not present

## 2018-02-04 DIAGNOSIS — M62562 Muscle wasting and atrophy, not elsewhere classified, left lower leg: Secondary | ICD-10-CM | POA: Diagnosis not present

## 2018-02-04 DIAGNOSIS — R269 Unspecified abnormalities of gait and mobility: Secondary | ICD-10-CM | POA: Diagnosis not present

## 2018-02-04 DIAGNOSIS — M25672 Stiffness of left ankle, not elsewhere classified: Secondary | ICD-10-CM | POA: Diagnosis not present

## 2018-02-04 DIAGNOSIS — M62572 Muscle wasting and atrophy, not elsewhere classified, left ankle and foot: Secondary | ICD-10-CM | POA: Diagnosis not present

## 2018-02-10 ENCOUNTER — Encounter: Payer: Self-pay | Admitting: Sports Medicine

## 2018-02-10 ENCOUNTER — Ambulatory Visit (INDEPENDENT_AMBULATORY_CARE_PROVIDER_SITE_OTHER): Payer: BLUE CROSS/BLUE SHIELD | Admitting: Sports Medicine

## 2018-02-10 DIAGNOSIS — M79671 Pain in right foot: Secondary | ICD-10-CM

## 2018-02-10 DIAGNOSIS — Z9889 Other specified postprocedural states: Secondary | ICD-10-CM

## 2018-02-10 DIAGNOSIS — M79672 Pain in left foot: Secondary | ICD-10-CM

## 2018-02-10 DIAGNOSIS — T148XXA Other injury of unspecified body region, initial encounter: Secondary | ICD-10-CM

## 2018-02-10 DIAGNOSIS — M7662 Achilles tendinitis, left leg: Secondary | ICD-10-CM

## 2018-02-10 DIAGNOSIS — M7730 Calcaneal spur, unspecified foot: Secondary | ICD-10-CM

## 2018-02-10 NOTE — Progress Notes (Signed)
Subjective: Mercedes Kline is a 42 y.o. female patient seen today in office for POV #6 (DOS 12/08/2017), S/P left Achilles tendon repair and peroneal tendon repair with removal of heel spur. Patient admits a little pain at the bottom of her heel a little but otherwise states that she is doing good reports that she is nervous about transitioning out of the boot. No other issues noted.   Patient Active Problem List   Diagnosis Date Noted  . Anxiety 12/31/2016    Current Outpatient Medications on File Prior to Visit  Medication Sig Dispense Refill  . ALPRAZolam (XANAX) 0.5 MG tablet Xanax  0.5 mg daily as needed.    Marland Kitchen escitalopram (LEXAPRO) 20 MG tablet Take 20 mg by mouth daily.    Marland Kitchen HYDROcodone-acetaminophen (NORCO) 10-325 MG tablet Take 1 tablet by mouth every 8 (eight) hours as needed. 20 tablet 0  . medroxyPROGESTERone (DEPO-PROVERA) 150 MG/ML injection Inject 150 mg into the muscle every 3 (three) months.    . meloxicam (MOBIC) 15 MG tablet Take 1 tablet (15 mg total) by mouth daily. 30 tablet 0  . methylPREDNISolone (MEDROL DOSEPAK) 4 MG TBPK tablet Take as instructed 21 tablet 0   Current Facility-Administered Medications on File Prior to Visit  Medication Dose Route Frequency Provider Last Rate Last Dose  . triamcinolone acetonide (KENALOG) 10 MG/ML injection 10 mg  10 mg Other Once Asencion Islam, DPM      . triamcinolone acetonide (KENALOG) 10 MG/ML injection 10 mg  10 mg Other Once Asencion Islam, DPM      . triamcinolone acetonide (KENALOG) 10 MG/ML injection 10 mg  10 mg Other Once Asencion Islam, DPM      . triamcinolone acetonide (KENALOG) 10 MG/ML injection 10 mg  10 mg Other Once Asencion Islam, DPM      . triamcinolone acetonide (KENALOG) 10 MG/ML injection 10 mg  10 mg Other Once Asencion Islam, DPM        No Known Allergies  Objective: There were no vitals filed for this visit.  General: No acute distress, AAOx3  Left foot: Incision well healed surgical site,  mild swelling to left posterior heel, no erythema, no warmth, no drainage, no signs of infection noted, Capillary fill time <3 seconds in all digits, gross sensation present via light touch to left foot.  No pain or crepitation with range of motion left foot.  Significant pain to the plantar heel at plantar fascial insertion however on exam I do not elicit any pain patient states that it is sore when she is in boot and put a heel cup to her boot to make her boot more comfortable, no pain with compression to the calcaneus, no pain with calf compression.   Assessment and Plan:  Problem List Items Addressed This Visit    None    Visit Diagnoses    Post-operative state    -  Primary   Calcaneal spur, unspecified laterality       Tendon tear       Pain of left heel       Achilles tendinitis, left leg       Heel pain, bilateral          -Patient seen and evaluated -Dispensed post op shoe and advised patient to slowly transition to the shoe from her boot and added an additional heel cushion to her boot and advised patient that the longer she stays in the boot can develop aggravation or pain because the boot  encourages increase heel weightbearing thus it is a good idea for her to transition to postop shoe as I have instructed -Continue with physical therapy -May continue with Motrin to assist with pain and swelling as needed -Increased hours today at work to 5-1/2 per shift -Will plan on weaning patient to tennis shoe in 2 to 3 weeks.  Patient to return to office as scheduled or sooner if problems or issues arise.  Asencion Islamitorya Corri Delapaz, DPM

## 2018-02-12 ENCOUNTER — Telehealth: Payer: Self-pay | Admitting: Sports Medicine

## 2018-02-12 NOTE — Telephone Encounter (Signed)
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 I need a heating pad , so I wont be hurting or in pain. I need a note, saying that I need a little heater at work in order for my job to ok it.

## 2018-02-13 NOTE — Telephone Encounter (Signed)
We can write a note for her allowing her to have a heater at her work area to help minimize post surgical pain from change of temperature Thanks Dr. SKathie Rhodes

## 2018-02-16 ENCOUNTER — Encounter: Payer: Self-pay | Admitting: *Deleted

## 2018-02-16 DIAGNOSIS — M62572 Muscle wasting and atrophy, not elsewhere classified, left ankle and foot: Secondary | ICD-10-CM | POA: Diagnosis not present

## 2018-02-16 DIAGNOSIS — M25672 Stiffness of left ankle, not elsewhere classified: Secondary | ICD-10-CM | POA: Diagnosis not present

## 2018-02-16 DIAGNOSIS — R269 Unspecified abnormalities of gait and mobility: Secondary | ICD-10-CM | POA: Diagnosis not present

## 2018-02-16 DIAGNOSIS — M62562 Muscle wasting and atrophy, not elsewhere classified, left lower leg: Secondary | ICD-10-CM | POA: Diagnosis not present

## 2018-02-16 NOTE — Telephone Encounter (Signed)
Pt called with fax [phone]. Faxed letter to pt's employer.

## 2018-02-16 NOTE — Telephone Encounter (Signed)
Letter faxed to pt's employer.

## 2018-02-16 NOTE — Telephone Encounter (Signed)
Left message informing pt Dr. Marylene LandStover had gotten back with me and I could write a note for her to have a heater at her desk to minimize post surgical pain from change of temperature.

## 2018-02-18 DIAGNOSIS — R269 Unspecified abnormalities of gait and mobility: Secondary | ICD-10-CM | POA: Diagnosis not present

## 2018-02-18 DIAGNOSIS — M62572 Muscle wasting and atrophy, not elsewhere classified, left ankle and foot: Secondary | ICD-10-CM | POA: Diagnosis not present

## 2018-02-18 DIAGNOSIS — M62562 Muscle wasting and atrophy, not elsewhere classified, left lower leg: Secondary | ICD-10-CM | POA: Diagnosis not present

## 2018-02-18 DIAGNOSIS — M25672 Stiffness of left ankle, not elsewhere classified: Secondary | ICD-10-CM | POA: Diagnosis not present

## 2018-02-28 DIAGNOSIS — M7662 Achilles tendinitis, left leg: Secondary | ICD-10-CM | POA: Diagnosis not present

## 2018-03-03 ENCOUNTER — Encounter: Payer: Self-pay | Admitting: Sports Medicine

## 2018-03-03 ENCOUNTER — Ambulatory Visit (INDEPENDENT_AMBULATORY_CARE_PROVIDER_SITE_OTHER): Payer: BLUE CROSS/BLUE SHIELD | Admitting: Sports Medicine

## 2018-03-03 ENCOUNTER — Ambulatory Visit (INDEPENDENT_AMBULATORY_CARE_PROVIDER_SITE_OTHER): Payer: BLUE CROSS/BLUE SHIELD

## 2018-03-03 DIAGNOSIS — M79672 Pain in left foot: Secondary | ICD-10-CM

## 2018-03-03 DIAGNOSIS — M7751 Other enthesopathy of right foot: Secondary | ICD-10-CM

## 2018-03-03 DIAGNOSIS — T148XXA Other injury of unspecified body region, initial encounter: Secondary | ICD-10-CM

## 2018-03-03 DIAGNOSIS — M7662 Achilles tendinitis, left leg: Secondary | ICD-10-CM

## 2018-03-03 DIAGNOSIS — Z9889 Other specified postprocedural states: Secondary | ICD-10-CM

## 2018-03-03 DIAGNOSIS — M7661 Achilles tendinitis, right leg: Secondary | ICD-10-CM | POA: Diagnosis not present

## 2018-03-03 DIAGNOSIS — M7730 Calcaneal spur, unspecified foot: Secondary | ICD-10-CM

## 2018-03-03 MED ORDER — TRIAMCINOLONE ACETONIDE 10 MG/ML IJ SUSP
10.0000 mg | Freq: Once | INTRAMUSCULAR | Status: AC
  Administered 2018-03-03: 10 mg

## 2018-03-03 NOTE — Progress Notes (Signed)
Subjective: Mercedes Kline is a 42 y.o. female patient seen today in office for POV #7 (DOS 12/08/2017), S/P left Achilles tendon repair and peroneal tendon repair with removal of heel spur. Patient states that there is a little soreness and numbness on the left foot but has new area of pain on the side and back of the right heel states that this area is excruciating especially because she has been doing a lot of favoring of her right foot since she has been recovering from surgery on her left.  Patient requests injection on the right.  No other issues noted.   Patient Active Problem List   Diagnosis Date Noted  . Anxiety 12/31/2016    Current Outpatient Medications on File Prior to Visit  Medication Sig Dispense Refill  . ALPRAZolam (XANAX) 0.5 MG tablet Xanax  0.5 mg daily as needed.    Marland Kitchen. escitalopram (LEXAPRO) 20 MG tablet Take 20 mg by mouth daily.    Marland Kitchen. HYDROcodone-acetaminophen (NORCO) 10-325 MG tablet Take 1 tablet by mouth every 8 (eight) hours as needed. 20 tablet 0  . medroxyPROGESTERone (DEPO-PROVERA) 150 MG/ML injection Inject 150 mg into the muscle every 3 (three) months.    . meloxicam (MOBIC) 15 MG tablet Take 1 tablet (15 mg total) by mouth daily. 30 tablet 0  . methylPREDNISolone (MEDROL DOSEPAK) 4 MG TBPK tablet Take as instructed 21 tablet 0   Current Facility-Administered Medications on File Prior to Visit  Medication Dose Route Frequency Provider Last Rate Last Dose  . triamcinolone acetonide (KENALOG) 10 MG/ML injection 10 mg  10 mg Other Once Asencion IslamStover, Ladon Vandenberghe, DPM      . triamcinolone acetonide (KENALOG) 10 MG/ML injection 10 mg  10 mg Other Once Asencion IslamStover, Amaru Burroughs, DPM      . triamcinolone acetonide (KENALOG) 10 MG/ML injection 10 mg  10 mg Other Once Asencion IslamStover, Marnee Sherrard, DPM      . triamcinolone acetonide (KENALOG) 10 MG/ML injection 10 mg  10 mg Other Once Asencion IslamStover, Quiara Killian, DPM      . triamcinolone acetonide (KENALOG) 10 MG/ML injection 10 mg  10 mg Other Once Asencion IslamStover, Timberly Yott,  DPM        No Known Allergies  Objective: There were no vitals filed for this visit.  General: No acute distress, AAOx3  Left foot: Incision well healed surgical site, mild swelling to left posterior heel, no erythema, no warmth, no drainage, no signs of infection noted, Capillary fill time <3 seconds in all digits, gross sensation present via light touch to left foot.  No pain or crepitation with range of motion left foot.  No reproducible pain on left, no pain with compression to the calcaneus, no pain with calf compression.   Right foot: Focal pain along the lateral ankle at the peroneal tendon course greater than the Achilles insertion likely from overuse tendinitis.  Assessment and Plan:  Problem List Items Addressed This Visit    None    Visit Diagnoses    Tendonitis of ankle, right    -  Primary   Relevant Medications   triamcinolone acetonide (KENALOG) 10 MG/ML injection 10 mg (Completed) (Start on 03/03/2018  9:15 PM)   Other Relevant Orders   DG Foot Complete Right (Completed)   Post-operative state       Calcaneal spur, unspecified laterality       Tendon tear       Pain of left heel       Achilles tendinitis, left leg          -  Patient seen and evaluated -After oral consent and aseptic prep, injected a mixture containing 1 ml of 2%  plain lidocaine, 1 ml 0.5% plain marcaine, 0.5 ml of kenalog 10 and 0.5 ml of dexamethasone phosphate into right lateral ankle along the peroneal tendon course without complication. Post-injection care discussed with patient.  -Advised patient that she may now transition on the left to a normal tennis shoe may slowly do this over a period of 1 week -Continue with physical therapy until completed -May continue with Motrin to assist with pain and swelling as needed -Patient is released to return to work full duty, full hours with no restriction on 03/09/2018 -Return for final postop check on left in 6 weeks and follow-up evaluation on pain on  right or sooner if problems or issues arise.  Asencion Islam, DPM

## 2018-03-05 DIAGNOSIS — Z23 Encounter for immunization: Secondary | ICD-10-CM | POA: Diagnosis not present

## 2018-03-16 DIAGNOSIS — Z309 Encounter for contraceptive management, unspecified: Secondary | ICD-10-CM | POA: Diagnosis not present

## 2018-03-16 DIAGNOSIS — Z3009 Encounter for other general counseling and advice on contraception: Secondary | ICD-10-CM | POA: Diagnosis not present

## 2018-03-30 DIAGNOSIS — M7662 Achilles tendinitis, left leg: Secondary | ICD-10-CM | POA: Diagnosis not present

## 2018-03-30 IMAGING — DX DG TOE 4TH 2+V*L*
3 series · 3 of 3 positions shown · non-contrast
Comparison: None.

CLINICAL DATA: Patient fell off a ladder with laceration to the
second toe.

EXAM:
LEFT FOURTH TOE

[toe ap]
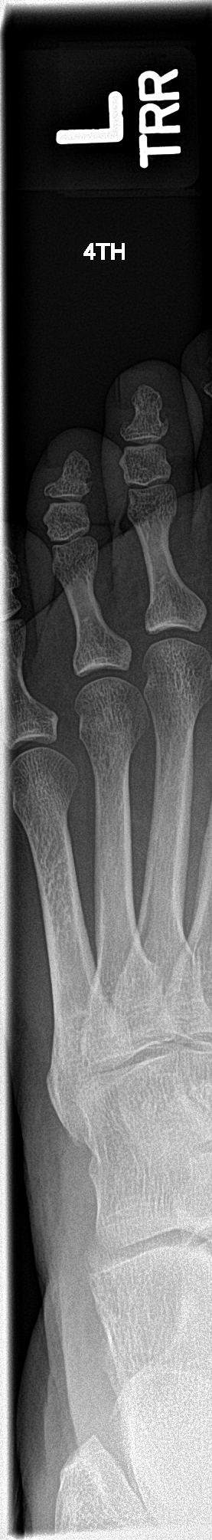

[toe obl]
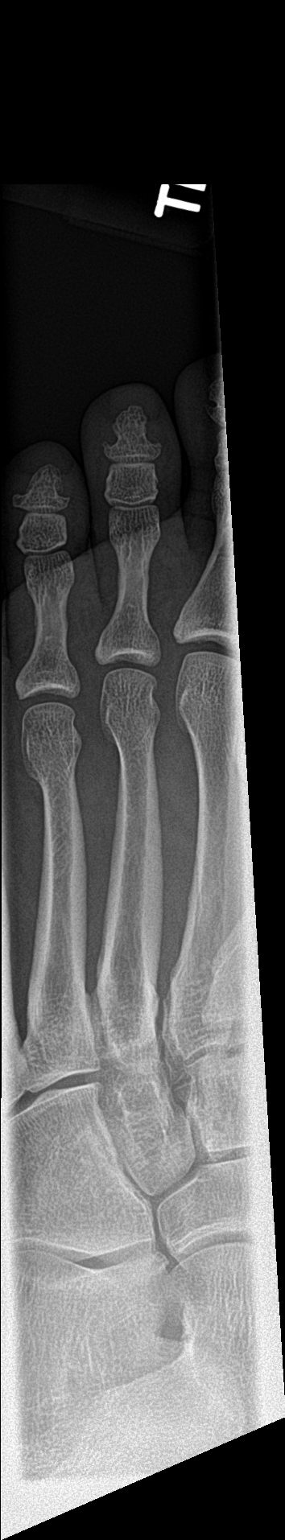

[toe lat]
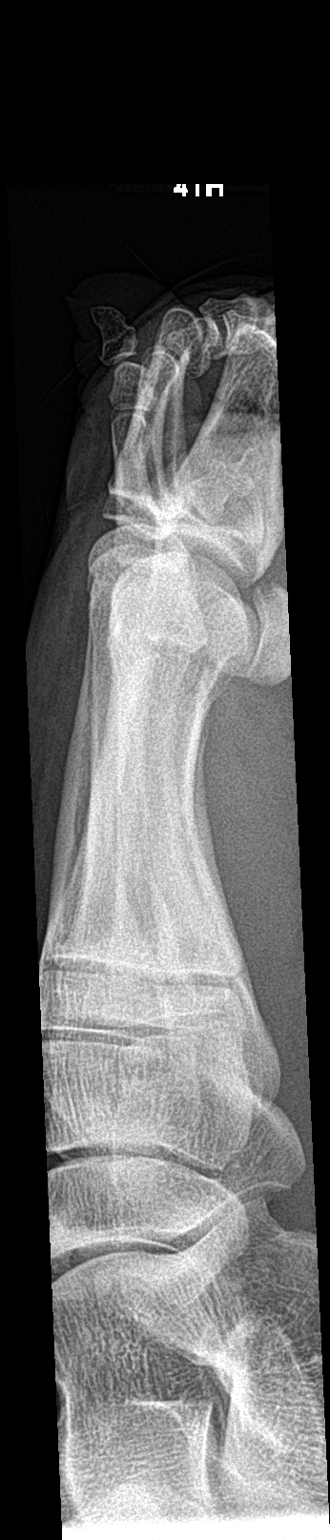

[3 of 3 positions shown; findings below may reference images not displayed]

FINDINGS: Images of the left fourth toe demonstrates suggestion of dorsal
subluxation of the distal phalanx with respect to the middle
phalanx. No fractures are demonstrated. No focal bone lesion or bone
destruction. Soft tissues are unremarkable. No radiopaque soft
tissue foreign bodies.
IMPRESSION: Dorsal subluxation of the distal phalanx of the left fourth toe. No
acute fractures.

## 2018-04-10 ENCOUNTER — Ambulatory Visit: Payer: BLUE CROSS/BLUE SHIELD | Admitting: Sports Medicine

## 2018-04-10 ENCOUNTER — Encounter: Payer: Self-pay | Admitting: Sports Medicine

## 2018-04-10 ENCOUNTER — Encounter: Payer: Self-pay | Admitting: *Deleted

## 2018-04-10 VITALS — BP 135/67 | HR 64 | Temp 98.2°F | Resp 16

## 2018-04-10 DIAGNOSIS — M7662 Achilles tendinitis, left leg: Secondary | ICD-10-CM

## 2018-04-10 DIAGNOSIS — M7751 Other enthesopathy of right foot: Secondary | ICD-10-CM

## 2018-04-10 DIAGNOSIS — Z9889 Other specified postprocedural states: Secondary | ICD-10-CM

## 2018-04-10 DIAGNOSIS — M7661 Achilles tendinitis, right leg: Secondary | ICD-10-CM

## 2018-04-10 MED ORDER — TRIAMCINOLONE ACETONIDE 10 MG/ML IJ SUSP
10.0000 mg | Freq: Once | INTRAMUSCULAR | Status: AC
  Administered 2018-04-10: 10 mg

## 2018-04-10 MED ORDER — MELOXICAM 15 MG PO TABS: 15.0000 mg | ORAL_TABLET | Freq: Every day | ORAL | 0 refills | Status: DC

## 2018-04-10 NOTE — Progress Notes (Signed)
Subjective: Mercedes Kline is a 43 y.o. female patient seen today in office for POV #8 (DOS 12/08/2017), S/P left Achilles tendon repair and peroneal tendon repair with removal of heel spur. Patient states that there is a little soreness and numbness on the left foot but has most of her pain on the right at the side and back of the right heel states that this area is excruciating especially at the end of the day and late afternoons pain today 5 out of 10.  Patient requests injection on the right.  No other issues noted.   Patient Active Problem List   Diagnosis Date Noted  . Anxiety 12/31/2016    Current Outpatient Medications on File Prior to Visit  Medication Sig Dispense Refill  . ALPRAZolam (XANAX) 0.5 MG tablet Xanax  0.5 mg daily as needed.    Marland Kitchen escitalopram (LEXAPRO) 20 MG tablet Take 20 mg by mouth daily.    Marland Kitchen HYDROcodone-acetaminophen (NORCO) 10-325 MG tablet Take 1 tablet by mouth every 8 (eight) hours as needed. 20 tablet 0  . medroxyPROGESTERone (DEPO-PROVERA) 150 MG/ML injection Inject 150 mg into the muscle every 3 (three) months.    . meloxicam (MOBIC) 15 MG tablet Take 1 tablet (15 mg total) by mouth daily. 30 tablet 0  . methylPREDNISolone (MEDROL DOSEPAK) 4 MG TBPK tablet Take as instructed 21 tablet 0   Current Facility-Administered Medications on File Prior to Visit  Medication Dose Route Frequency Provider Last Rate Last Dose  . triamcinolone acetonide (KENALOG) 10 MG/ML injection 10 mg  10 mg Other Once Asencion Islam, DPM      . triamcinolone acetonide (KENALOG) 10 MG/ML injection 10 mg  10 mg Other Once Asencion Islam, DPM      . triamcinolone acetonide (KENALOG) 10 MG/ML injection 10 mg  10 mg Other Once Asencion Islam, DPM      . triamcinolone acetonide (KENALOG) 10 MG/ML injection 10 mg  10 mg Other Once Asencion Islam, DPM      . triamcinolone acetonide (KENALOG) 10 MG/ML injection 10 mg  10 mg Other Once Asencion Islam, DPM        No Known  Allergies  Objective: There were no vitals filed for this visit.  General: No acute distress, AAOx3  Left foot: Incision well healed surgical site, mild swelling to left posterior heel, no erythema, no warmth, no drainage, no signs of infection noted, Capillary fill time <3 seconds in all digits, gross sensation present via light touch to left foot.  No pain or crepitation with range of motion left foot.  No reproducible pain on left, no pain with compression to the calcaneus, no pain with calf compression.   Right foot: Focal pain along the lateral ankle at the peroneal tendon course and the Achilles insertion likely from overuse tendinitis with the pain being greatest today at the lateral insertion of the Achilles.  Assessment and Plan:  Problem List Items Addressed This Visit    None    Visit Diagnoses    Tendonitis of ankle, right    -  Primary   Relevant Medications   triamcinolone acetonide (KENALOG) 10 MG/ML injection 10 mg (Completed) (Start on 04/10/2018  9:30 AM)   Post-operative state          -Patient seen and evaluated -After oral consent and aseptic prep, injected a mixture containing 1 ml of 2%  plain lidocaine, 1 ml 0.5% plain marcaine, 0.5 ml of kenalog 10 and 0.5 ml of dexamethasone phosphate into  along the lateral insertion of the Achilles without trauma or injury to the tendon on the right without complication. Post-injection care discussed with patient.  -Advised patient that she should rest today and work excuse given and to use cam boot for the next week on her right to prevent any injury to the Achilles tendon after injection and may slowly transition to tennis shoes with heel lifts -Avoid excessive strenuous activity and may continue with gentle stretching however no loading stretching to prevent any injury to right Achilles tendon -Prescribed meloxicam to take for inflammation this will replace any previous use of Motrin -Continue with full duty at work with no  restrictions -Return for in a month follow-up evaluation of right foot pain and to determine if we need to start planning for surgery however would like to hold off until after April to at least give her left foot at least a 52-month window to completely recover or sooner if problems or issues arise.  Asencion Islam, DPM

## 2018-04-14 ENCOUNTER — Ambulatory Visit: Payer: BLUE CROSS/BLUE SHIELD | Admitting: Sports Medicine

## 2018-04-14 DIAGNOSIS — Z713 Dietary counseling and surveillance: Secondary | ICD-10-CM | POA: Diagnosis not present

## 2018-04-14 DIAGNOSIS — Z6836 Body mass index (BMI) 36.0-36.9, adult: Secondary | ICD-10-CM | POA: Diagnosis not present

## 2018-04-14 DIAGNOSIS — G4700 Insomnia, unspecified: Secondary | ICD-10-CM | POA: Diagnosis not present

## 2018-04-20 ENCOUNTER — Telehealth: Payer: Self-pay | Admitting: Sports Medicine

## 2018-04-20 NOTE — Telephone Encounter (Signed)
Pts employer called stating they had received fax for pt to use a heater under her desk to reduce post surgical pain. Due to safety regulations they are needing some clarification. They are wanting clarification on specific needs and if the use of the heater is indefinite because there is no time frame on the fax.

## 2018-04-21 NOTE — Telephone Encounter (Signed)
Left message informing Joselyn Glassman of Dr. Wynema Birch 04/21/2018 11:38am explanation of the use of the heater to be reevaluated 05/19/2018.

## 2018-04-21 NOTE — Telephone Encounter (Signed)
Joselyn Glassman called asked if pt could use a heated sock. I told Joselyn Glassman that often if there is a change how pt is able to regulate heat/warmth there may also be a change in sensation, and it could lead to a burn. I offered to check with another doctor about the heated sock, or possible heated gel pad with skin protected from the heat with a thick sock. Joselyn Glassman ask to check another doctor and call again.

## 2018-04-21 NOTE — Telephone Encounter (Signed)
Can she use a heating pack intermittently?

## 2018-04-21 NOTE — Telephone Encounter (Signed)
Use to help reduce post surgical pain. Heater used a discretion of patient/ PRN until next office visit.

## 2018-04-21 NOTE — Telephone Encounter (Addendum)
Enid Derry - Environmental Safety Coordinator called concerning the heater pt is to use in office.

## 2018-04-22 NOTE — Telephone Encounter (Signed)
Left message informing Mercedes Kline of Dr. Gabriel Rung recommendation and to make sure pt protects her skin from that heat source with a light sock.

## 2018-04-30 ENCOUNTER — Telehealth: Payer: Self-pay | Admitting: *Deleted

## 2018-04-30 DIAGNOSIS — M7662 Achilles tendinitis, left leg: Secondary | ICD-10-CM | POA: Diagnosis not present

## 2018-04-30 NOTE — Telephone Encounter (Signed)
I called pt and explained in had [facility] from [facility] safety division, and had given the reason for the space heater to be used and that he had asked for an alternate. I told pt I had given the alternate so that she would have a way of keeping warm as her foot acclimated to it's new normal. I told pt that the letter had been sent to HR and that it did not give any personal information or even specifics concerning her surgery. Pt states Joselyn Glassmanyler is not part of HR. I told pt I had not been made aware of that and I apologized for causing any problem. I told pt I would have my office manager contact her tomorrow.

## 2018-04-30 NOTE — Telephone Encounter (Signed)
Pt called and asked who had given her information out concerning the space heater.

## 2018-05-05 ENCOUNTER — Telehealth: Payer: Self-pay | Admitting: *Deleted

## 2018-05-05 NOTE — Telephone Encounter (Signed)
Pt called, asked if I would speak with HR - Eileen Stanford concerning my conversation with Enid Derry. Eileen Stanford asked if he identified himself and I told her he did and I knew he was with a safety division of pt's company. I sent a message to Dr. Marylene Land requesting wording to convey the use of the heat for pt's post-op recovery. I spoke with Joselyn Glassman and informed of Dr. Wynema Birch explanation. I informed Jenna and pt, I had thought Joselyn Glassman had either spoken to pt or HR and they were aware of the request from [place]. Joselyn Glassman knew to contact me at the [facility]. I had only contacted pt or sent post-op heater note to pt for HR, I had no previous communication with Joselyn Glassman. Pt came on the phone and stated Joselyn Glassman had gone over company HR to corporate HR, and it had nothing to do with the doctor's office or me, they had wanted to understand the conversation.

## 2018-05-19 ENCOUNTER — Ambulatory Visit (INDEPENDENT_AMBULATORY_CARE_PROVIDER_SITE_OTHER): Payer: BLUE CROSS/BLUE SHIELD | Admitting: Sports Medicine

## 2018-05-19 ENCOUNTER — Encounter: Payer: Self-pay | Admitting: Sports Medicine

## 2018-05-19 DIAGNOSIS — Z9889 Other specified postprocedural states: Secondary | ICD-10-CM | POA: Diagnosis not present

## 2018-05-19 DIAGNOSIS — M7751 Other enthesopathy of right foot: Secondary | ICD-10-CM | POA: Diagnosis not present

## 2018-05-19 DIAGNOSIS — M7730 Calcaneal spur, unspecified foot: Secondary | ICD-10-CM

## 2018-05-19 DIAGNOSIS — M7662 Achilles tendinitis, left leg: Secondary | ICD-10-CM

## 2018-05-19 DIAGNOSIS — M7661 Achilles tendinitis, right leg: Secondary | ICD-10-CM | POA: Diagnosis not present

## 2018-05-19 MED ORDER — MELOXICAM 15 MG PO TABS: 15.0000 mg | ORAL_TABLET | Freq: Every day | ORAL | 0 refills | Status: DC

## 2018-05-19 NOTE — Progress Notes (Signed)
Subjective: Mercedes Kline is a 43 y.o. female patient seen today in office for POV #9 (DOS 12/08/2017), S/P left Achilles tendon repair and peroneal tendon repair with removal of heel spur. Patient states that there is a little soreness and numbness on the left ankle now on outside and Right heel at achilles last injection helped and Mobic helped but pain on right is not completely gone.  Patient reports that HR stopped allowing her to use heater. No other issues noted.   Patient Active Problem List   Diagnosis Date Noted  . Anxiety 12/31/2016    Current Outpatient Medications on File Prior to Visit  Medication Sig Dispense Refill  . ALPRAZolam (XANAX) 0.5 MG tablet Xanax  0.5 mg daily as needed.    Marland Kitchen escitalopram (LEXAPRO) 20 MG tablet Take 20 mg by mouth daily.    Marland Kitchen HYDROcodone-acetaminophen (NORCO) 10-325 MG tablet Take 1 tablet by mouth every 8 (eight) hours as needed. 20 tablet 0  . medroxyPROGESTERone (DEPO-PROVERA) 150 MG/ML injection Inject 150 mg into the muscle every 3 (three) months.    . methylPREDNISolone (MEDROL DOSEPAK) 4 MG TBPK tablet Take as instructed 21 tablet 0   Current Facility-Administered Medications on File Prior to Visit  Medication Dose Route Frequency Provider Last Rate Last Dose  . triamcinolone acetonide (KENALOG) 10 MG/ML injection 10 mg  10 mg Other Once Asencion Islam, DPM      . triamcinolone acetonide (KENALOG) 10 MG/ML injection 10 mg  10 mg Other Once Asencion Islam, DPM      . triamcinolone acetonide (KENALOG) 10 MG/ML injection 10 mg  10 mg Other Once Asencion Islam, DPM      . triamcinolone acetonide (KENALOG) 10 MG/ML injection 10 mg  10 mg Other Once Asencion Islam, DPM      . triamcinolone acetonide (KENALOG) 10 MG/ML injection 10 mg  10 mg Other Once Asencion Islam, DPM        No Known Allergies  Objective: There were no vitals filed for this visit.  General: No acute distress, AAOx3  Left foot: Incision well healed surgical site,  mild swelling to left posterior heel, no erythema, no warmth, no drainage, no signs of infection noted, Capillary fill time <3 seconds in all digits, gross sensation present via light touch to left foot.  No pain or crepitation with range of motion left foot.  No reproducible pain on left however subjectively some lateral ankle pain, no pain with compression to the calcaneus, no pain with calf compression.   Right foot: Focal pain along the lateral ankle at the peroneal tendon course and the Achilles insertion likely from overuse tendinitis with the pain being greatest today at the lateral insertion of the Achilles like before but much improved from prior.  Assessment and Plan:  Problem List Items Addressed This Visit    None    Visit Diagnoses    Tendonitis of ankle, right    -  Primary   Achilles tendinitis, right leg       Relevant Medications   meloxicam (MOBIC) 15 MG tablet   Post-operative state       Achilles tendinitis, left leg       Relevant Medications   meloxicam (MOBIC) 15 MG tablet   Calcaneal spur, unspecified laterality          -Patient seen and evaluated -No reinjection this visit -Dispensed Achilles sleeves   -Advised patient to continue with rest, ice, elevation and Mobic  -Return in 6  weeks follow-up evaluation of right foot pain and possibly discuss surgery if fails to improve.  Asencion Islam, DPM

## 2018-05-30 DIAGNOSIS — M7662 Achilles tendinitis, left leg: Secondary | ICD-10-CM | POA: Diagnosis not present

## 2018-06-02 DIAGNOSIS — Z309 Encounter for contraceptive management, unspecified: Secondary | ICD-10-CM | POA: Diagnosis not present

## 2018-06-02 DIAGNOSIS — Z3009 Encounter for other general counseling and advice on contraception: Secondary | ICD-10-CM | POA: Diagnosis not present

## 2018-06-05 DIAGNOSIS — Z6835 Body mass index (BMI) 35.0-35.9, adult: Secondary | ICD-10-CM | POA: Diagnosis not present

## 2018-06-05 DIAGNOSIS — G4700 Insomnia, unspecified: Secondary | ICD-10-CM | POA: Diagnosis not present

## 2018-06-05 DIAGNOSIS — Z713 Dietary counseling and surveillance: Secondary | ICD-10-CM | POA: Diagnosis not present

## 2018-06-25 DIAGNOSIS — Z6834 Body mass index (BMI) 34.0-34.9, adult: Secondary | ICD-10-CM | POA: Diagnosis not present

## 2018-06-25 DIAGNOSIS — J019 Acute sinusitis, unspecified: Secondary | ICD-10-CM | POA: Diagnosis not present

## 2018-06-28 DIAGNOSIS — M7662 Achilles tendinitis, left leg: Secondary | ICD-10-CM | POA: Diagnosis not present

## 2018-06-28 DIAGNOSIS — T148XXA Other injury of unspecified body region, initial encounter: Secondary | ICD-10-CM | POA: Diagnosis not present

## 2018-06-28 DIAGNOSIS — M7730 Calcaneal spur, unspecified foot: Secondary | ICD-10-CM | POA: Diagnosis not present

## 2018-06-30 ENCOUNTER — Ambulatory Visit: Payer: BLUE CROSS/BLUE SHIELD | Admitting: Sports Medicine

## 2018-07-06 DIAGNOSIS — Z713 Dietary counseling and surveillance: Secondary | ICD-10-CM | POA: Diagnosis not present

## 2018-07-06 DIAGNOSIS — G4700 Insomnia, unspecified: Secondary | ICD-10-CM | POA: Diagnosis not present

## 2018-07-06 DIAGNOSIS — F419 Anxiety disorder, unspecified: Secondary | ICD-10-CM | POA: Diagnosis not present

## 2018-07-06 DIAGNOSIS — Z6833 Body mass index (BMI) 33.0-33.9, adult: Secondary | ICD-10-CM | POA: Diagnosis not present

## 2018-07-17 ENCOUNTER — Other Ambulatory Visit: Payer: Self-pay

## 2018-07-17 ENCOUNTER — Ambulatory Visit: Payer: BLUE CROSS/BLUE SHIELD | Admitting: Sports Medicine

## 2018-07-17 ENCOUNTER — Encounter: Payer: Self-pay | Admitting: Sports Medicine

## 2018-07-17 VITALS — Temp 98.9°F | Resp 16

## 2018-07-17 DIAGNOSIS — M7751 Other enthesopathy of right foot: Secondary | ICD-10-CM | POA: Diagnosis not present

## 2018-07-17 DIAGNOSIS — M79671 Pain in right foot: Secondary | ICD-10-CM

## 2018-07-17 DIAGNOSIS — M79672 Pain in left foot: Secondary | ICD-10-CM

## 2018-07-17 DIAGNOSIS — Z9889 Other specified postprocedural states: Secondary | ICD-10-CM

## 2018-07-17 DIAGNOSIS — T148XXA Other injury of unspecified body region, initial encounter: Secondary | ICD-10-CM

## 2018-07-17 DIAGNOSIS — M7661 Achilles tendinitis, right leg: Secondary | ICD-10-CM

## 2018-07-17 DIAGNOSIS — M7662 Achilles tendinitis, left leg: Secondary | ICD-10-CM

## 2018-07-17 MED ORDER — PREDNISONE 10 MG (21) PO TBPK: ORAL_TABLET | ORAL | 0 refills | Status: DC

## 2018-07-17 MED ORDER — TRIAMCINOLONE ACETONIDE 40 MG/ML IJ SUSP
20.0000 mg | Freq: Once | INTRAMUSCULAR | Status: AC
  Administered 2018-07-17: 20 mg

## 2018-07-17 NOTE — Progress Notes (Signed)
Subjective: Mercedes Kline is a 43 y.o. female patient seen today in office for POV #10 (DOS 12/08/2017), S/P left Achilles tendon repair and peroneal tendon repair with removal of heel spur. Patient states that there is a little soreness and numbness on the left ankle but it does not hurt like before surgery and feels good relatively compared to the right.  Patient reports that the pain in the right heel is becoming more bothersome the pain is 8 out of 10 and worse at the end of the day.  Patient states that her heel lifts help and taking her anti-inflammatories help but the pain still tends to come back with swelling by the end of the day.  Patient denies nausea vomiting fever chills or any other constitutional symptoms at this time.  Patient denies any changes with medications or any new problems since last visit. No other issues noted.   Patient Active Problem List   Diagnosis Date Noted  . Anxiety 12/31/2016    Current Outpatient Medications on File Prior to Visit  Medication Sig Dispense Refill  . ALPRAZolam (XANAX) 0.5 MG tablet Xanax  0.5 mg daily as needed.    Marland Kitchen escitalopram (LEXAPRO) 20 MG tablet Take 20 mg by mouth daily.    Marland Kitchen HYDROcodone-acetaminophen (NORCO) 10-325 MG tablet Take 1 tablet by mouth every 8 (eight) hours as needed. 20 tablet 0  . medroxyPROGESTERone (DEPO-PROVERA) 150 MG/ML injection Inject 150 mg into the muscle every 3 (three) months.    . meloxicam (MOBIC) 15 MG tablet Take 1 tablet (15 mg total) by mouth daily. 30 tablet 0  . methylPREDNISolone (MEDROL DOSEPAK) 4 MG TBPK tablet Take as instructed 21 tablet 0   Current Facility-Administered Medications on File Prior to Visit  Medication Dose Route Frequency Provider Last Rate Last Dose  . triamcinolone acetonide (KENALOG) 10 MG/ML injection 10 mg  10 mg Other Once Asencion Islam, DPM      . triamcinolone acetonide (KENALOG) 10 MG/ML injection 10 mg  10 mg Other Once Asencion Islam, DPM      . triamcinolone  acetonide (KENALOG) 10 MG/ML injection 10 mg  10 mg Other Once Asencion Islam, DPM      . triamcinolone acetonide (KENALOG) 10 MG/ML injection 10 mg  10 mg Other Once Asencion Islam, DPM      . triamcinolone acetonide (KENALOG) 10 MG/ML injection 10 mg  10 mg Other Once Asencion Islam, DPM        No Known Allergies  Objective: There were no vitals filed for this visit.  General: No acute distress, AAOx3  Left foot: Incision well healed surgical site, minimal swelling to left posterior heel, no erythema, no warmth, no drainage, no signs of infection noted, Capillary fill time <3 seconds in all digits, gross sensation present via light touch to left foot.  No pain or crepitation with range of motion left foot.  No reproducible pain on left however subjectively some numbness along the incision, no pain with compression to the calcaneus, no pain with calf compression.   Right foot: Focal pain along the Achilles tendon insertion medial greater than lateral.  Assessment and Plan:  Problem List Items Addressed This Visit    None    Visit Diagnoses    Tendonitis of ankle, right    -  Primary   Relevant Medications   predniSONE (STERAPRED UNI-PAK 21 TAB) 10 MG (21) TBPK tablet   triamcinolone acetonide (KENALOG-40) injection 20 mg (Completed) (Start on 07/17/2018  9:00  AM)   Achilles tendinitis, right leg       Inflammatory heel pain, right       Post-operative state       Achilles tendinitis, left leg       Tendon tear       Pain of left heel          -Patient seen and evaluated -After oral consent and aseptic prep, injected a mixture containing 1 ml of 2%  plain lidocaine, 1 ml 0.5% plain marcaine, 0.5 ml of kenalog 40 and 0.5 ml of dexamethasone phosphate into along the medial insertion of the Achilles on the right heel with care not to injure the tendon.  There were no immediate complications noted. Post-injection care discussed with patient.  -Advised patient to continue with cam  boot as needed -Dispensed new set of heel lifts -Advised patient to continue with rest, ice, elevation and prednisone as prescribed -Return as needed for follow-up evaluation of right foot pain and possibly discuss surgery if fails to improve however at this time will try to hold off until patient reports that pain is unbearable and she wants surgery.  Asencion Islamitorya Irwin Toran, DPM

## 2018-07-30 DIAGNOSIS — M7662 Achilles tendinitis, left leg: Secondary | ICD-10-CM | POA: Diagnosis not present

## 2018-07-30 DIAGNOSIS — T148XXA Other injury of unspecified body region, initial encounter: Secondary | ICD-10-CM | POA: Diagnosis not present

## 2018-07-30 DIAGNOSIS — M7730 Calcaneal spur, unspecified foot: Secondary | ICD-10-CM | POA: Diagnosis not present

## 2018-08-10 ENCOUNTER — Telehealth: Payer: Self-pay | Admitting: *Deleted

## 2018-08-10 NOTE — Telephone Encounter (Signed)
I called pt and offered an appt, she states she doesn't know if she wants to come in now. I told pt if she is having any pain and hurt an area previously seen for a problem in the office she should make an appt to make sure no more damage had been done, and to go into a stabilizing boot to decrease movement and pain to the area. Transferred pt to schedulers.

## 2018-08-10 NOTE — Telephone Encounter (Signed)
Pt states she fell this weekend and wanted to talk to Dr. Marylene Land.

## 2018-08-14 ENCOUNTER — Encounter (INDEPENDENT_AMBULATORY_CARE_PROVIDER_SITE_OTHER): Payer: Self-pay

## 2018-08-14 ENCOUNTER — Ambulatory Visit (INDEPENDENT_AMBULATORY_CARE_PROVIDER_SITE_OTHER): Payer: BLUE CROSS/BLUE SHIELD

## 2018-08-14 ENCOUNTER — Ambulatory Visit: Payer: BLUE CROSS/BLUE SHIELD | Admitting: Sports Medicine

## 2018-08-14 ENCOUNTER — Encounter: Payer: Self-pay | Admitting: Sports Medicine

## 2018-08-14 ENCOUNTER — Other Ambulatory Visit: Payer: Self-pay

## 2018-08-14 VITALS — Temp 98.0°F | Resp 16

## 2018-08-14 DIAGNOSIS — M25571 Pain in right ankle and joints of right foot: Secondary | ICD-10-CM | POA: Diagnosis not present

## 2018-08-14 DIAGNOSIS — R600 Localized edema: Secondary | ICD-10-CM | POA: Diagnosis not present

## 2018-08-14 DIAGNOSIS — R58 Hemorrhage, not elsewhere classified: Secondary | ICD-10-CM

## 2018-08-14 DIAGNOSIS — S99921A Unspecified injury of right foot, initial encounter: Secondary | ICD-10-CM

## 2018-08-14 DIAGNOSIS — M7751 Other enthesopathy of right foot: Secondary | ICD-10-CM

## 2018-08-14 DIAGNOSIS — S93401A Sprain of unspecified ligament of right ankle, initial encounter: Secondary | ICD-10-CM | POA: Diagnosis not present

## 2018-08-14 MED ORDER — HYDROCODONE-ACETAMINOPHEN 5-325 MG PO TABS: 1.0000 | ORAL_TABLET | Freq: Four times a day (QID) | ORAL | 0 refills | Status: AC | PRN

## 2018-08-14 NOTE — Progress Notes (Signed)
Subjective:  Mercedes Kline is a 43 y.o. female patient who presents to office for evaluation of Right ankle pain. Patient complains of continued pain in the ankle after tripping and falling on Saturday while walking outside and tripped over her garden fence and hurt her hip/low back and right ankle. Patient reports pain 7/10 hurts to walk with bruising to shin and ankle. Patient has tried Motrin and elevation with no relief in symptoms. Patient reports that the back of her heel hurts too on right. Patient denies any other pedal complaints.  Patient Active Problem List   Diagnosis Date Noted  . Anxiety 12/31/2016    Current Outpatient Medications on File Prior to Visit  Medication Sig Dispense Refill  . ALPRAZolam (XANAX) 0.5 MG tablet Xanax  0.5 mg daily as needed.    Marland Kitchen. escitalopram (LEXAPRO) 20 MG tablet Take 20 mg by mouth daily.    . medroxyPROGESTERone (DEPO-PROVERA) 150 MG/ML injection Inject 150 mg into the muscle every 3 (three) months.    . phentermine (ADIPEX-P) 37.5 MG tablet TAKE 1 TABLET BY MOUTH EVERY MORNING FOR APPETTIE SUPPRESSION    . predniSONE (STERAPRED UNI-PAK 21 TAB) 10 MG (21) TBPK tablet Take as directed 21 tablet 0  . zolpidem (AMBIEN) 10 MG tablet TAKE 1 TABLET BY MOUTH AT BEDTIME AS NEEDED FOR INSOMNIA     Current Facility-Administered Medications on File Prior to Visit  Medication Dose Route Frequency Provider Last Rate Last Dose  . triamcinolone acetonide (KENALOG) 10 MG/ML injection 10 mg  10 mg Other Once Asencion IslamStover, Julian Askin, DPM      . triamcinolone acetonide (KENALOG) 10 MG/ML injection 10 mg  10 mg Other Once Asencion IslamStover, Antia Rahal, DPM      . triamcinolone acetonide (KENALOG) 10 MG/ML injection 10 mg  10 mg Other Once Asencion IslamStover, Mileigh Tilley, DPM      . triamcinolone acetonide (KENALOG) 10 MG/ML injection 10 mg  10 mg Other Once Asencion IslamStover, Simcha Farrington, DPM      . triamcinolone acetonide (KENALOG) 10 MG/ML injection 10 mg  10 mg Other Once Asencion IslamStover, Jaheim Canino, DPM        No Known  Allergies  Objective:  General: Alert and oriented x3 in no acute distress  Dermatology: No open lesions bilateral lower extremities, no webspace macerations, + ecchymosis right ankle lateral>medial aspect and anterior shin, all nails x 10 are well manicured. Surgical scar well healed on left.   Vascular: Dorsalis Pedis and Posterior Tibial pedal pulses palpable, Capillary Fill Time 3 seconds,(+) pedal hair growth bilateral, no edema bilateral lower extremities, Temperature gradient within normal limits.  Neurology: Michaell CowingGross sensation intact via light touch bilateral.  Musculoskeletal: Mild tenderness with palpation at medial ankle on right, more severe tenderness to lateral ankle greater than posterior heel. Negative talar tilt, Negative tib-fib stress, No instability. No pain with calf compression bilateral. Achilles intact. Thompson sign negative. Range of motion within normal limits with mild guarding on Right ankle. Strength within normal limits in all groups bilateral.   Gait: Antalgic gait  Xrays  Right foot/ankle   Impression: Unchanged heel spur. Mild soft tissue swelling. No other acute findings.   Assessment and Plan: Problem List Items Addressed This Visit    None    Visit Diagnoses    Sprain of right ankle, unspecified ligament, initial encounter    -  Primary   08-08-18 fell   Relevant Medications   HYDROcodone-acetaminophen (NORCO) 5-325 MG tablet   Tendonitis of ankle, right  Relevant Orders   DG Ankle Complete Right   Injury of right foot, initial encounter       08-08-18 fell   Relevant Orders   DG Ankle Complete Right   Acute right ankle pain       Relevant Medications   HYDROcodone-acetaminophen (NORCO) 5-325 MG tablet   Localized edema       Ecchymosis          -Complete examination performed -Xrays reviewed -Discussed treatement options for sprain injury -Applied unna boot for edema control to keep intact for 5 days and then after removal may return  to using ace wrap -Rx Norco for pain not relieved by Motrin -Dispensed a walking boot for patient to use for the next 2 weeks -Recommend rest, ice, elevation -Patient to return to office in 3 weeks for follow up evaluation or sooner if condition worsens.  Asencion Islam, DPM

## 2018-08-18 DIAGNOSIS — Z3009 Encounter for other general counseling and advice on contraception: Secondary | ICD-10-CM | POA: Diagnosis not present

## 2018-08-18 DIAGNOSIS — Z309 Encounter for contraceptive management, unspecified: Secondary | ICD-10-CM | POA: Diagnosis not present

## 2018-08-29 DIAGNOSIS — M7662 Achilles tendinitis, left leg: Secondary | ICD-10-CM | POA: Diagnosis not present

## 2018-08-29 DIAGNOSIS — M7730 Calcaneal spur, unspecified foot: Secondary | ICD-10-CM | POA: Diagnosis not present

## 2018-08-29 DIAGNOSIS — T148XXA Other injury of unspecified body region, initial encounter: Secondary | ICD-10-CM | POA: Diagnosis not present

## 2018-09-04 ENCOUNTER — Other Ambulatory Visit: Payer: Self-pay

## 2018-09-04 ENCOUNTER — Encounter: Payer: Self-pay | Admitting: Sports Medicine

## 2018-09-04 ENCOUNTER — Ambulatory Visit: Payer: BLUE CROSS/BLUE SHIELD | Admitting: Sports Medicine

## 2018-09-04 DIAGNOSIS — M76812 Anterior tibial syndrome, left leg: Secondary | ICD-10-CM | POA: Diagnosis not present

## 2018-09-04 DIAGNOSIS — M79672 Pain in left foot: Secondary | ICD-10-CM | POA: Diagnosis not present

## 2018-09-04 DIAGNOSIS — S93401D Sprain of unspecified ligament of right ankle, subsequent encounter: Secondary | ICD-10-CM

## 2018-09-04 DIAGNOSIS — M79671 Pain in right foot: Secondary | ICD-10-CM

## 2018-09-04 NOTE — Progress Notes (Signed)
Subjective:  Mercedes Kline is a 43 y.o. female patient who returns to office to follow up on Right foot sprain. Patient reports that her foot feel better but does now have a little numbness to the back of her right heel and a new pain at the front of left ankle that started after last visit, thinks it is from walking funny because of the right foot pain. Patient denies redness, warmth, drainage. Reports pain on left is only with walking 3/10 and goes away with rest. Patient denies any other pedal complaints.  Patient Active Problem List   Diagnosis Date Noted  . Anxiety 12/31/2016    Current Outpatient Medications on File Prior to Visit  Medication Sig Dispense Refill  . ALPRAZolam (XANAX) 0.5 MG tablet Xanax  0.5 mg daily as needed.    Marland Kitchen escitalopram (LEXAPRO) 20 MG tablet Take 20 mg by mouth daily.    . medroxyPROGESTERone (DEPO-PROVERA) 150 MG/ML injection Inject 150 mg into the muscle every 3 (three) months.    . phentermine (ADIPEX-P) 37.5 MG tablet TAKE 1 TABLET BY MOUTH EVERY MORNING FOR APPETTIE SUPPRESSION    . predniSONE (STERAPRED UNI-PAK 21 TAB) 10 MG (21) TBPK tablet Take as directed 21 tablet 0  . zolpidem (AMBIEN) 10 MG tablet TAKE 1 TABLET BY MOUTH AT BEDTIME AS NEEDED FOR INSOMNIA     Current Facility-Administered Medications on File Prior to Visit  Medication Dose Route Frequency Provider Last Rate Last Dose  . triamcinolone acetonide (KENALOG) 10 MG/ML injection 10 mg  10 mg Other Once Asencion Islam, DPM      . triamcinolone acetonide (KENALOG) 10 MG/ML injection 10 mg  10 mg Other Once Asencion Islam, DPM      . triamcinolone acetonide (KENALOG) 10 MG/ML injection 10 mg  10 mg Other Once Asencion Islam, DPM      . triamcinolone acetonide (KENALOG) 10 MG/ML injection 10 mg  10 mg Other Once Asencion Islam, DPM      . triamcinolone acetonide (KENALOG) 10 MG/ML injection 10 mg  10 mg Other Once Asencion Islam, DPM        No Known Allergies  Objective:  General:  Alert and oriented x3 in no acute distress  Dermatology: No open lesions bilateral lower extremities, no webspace macerations, resolved ecchymosis, all nails x 10 are well manicured. Surgical scar well healed on left.   Vascular: Dorsalis Pedis and Posterior Tibial pedal pulses palpable, Capillary Fill Time 3 seconds,(+) pedal hair growth bilateral, no edema bilateral lower extremities, Temperature gradient within normal limits.  Neurology: Gross sensation intact via light touch bilateral. Subjective numbness to right heel.  Musculoskeletal: Mild tenderness with palpation at TA tendon on left with no gap. No pain to right foot/ankle. ROM within normal limits. Strength within normal limits in all groups bilateral.   Assessment and Plan: Problem List Items Addressed This Visit    None    Visit Diagnoses    Anterior tibial tendonitis, left    -  Primary   Sprain of right ankle, unspecified ligament, subsequent encounter       Foot pain, bilateral          -Complete examination performed -Discuss treatment plan for sprain and tendonitis  -Dispensed anklet to use on left for 1 month -Continue with Motrin PRN -Recommend rest, ice, elevation PRN and heel lifts as dispensed -Continue to monitor symptoms if worsen return to office -Patient to return to office PRN or sooner if condition worsens.  Asencion Islam,  DPM

## 2018-09-29 DIAGNOSIS — M7730 Calcaneal spur, unspecified foot: Secondary | ICD-10-CM | POA: Diagnosis not present

## 2018-09-29 DIAGNOSIS — T148XXA Other injury of unspecified body region, initial encounter: Secondary | ICD-10-CM | POA: Diagnosis not present

## 2018-09-29 DIAGNOSIS — M7662 Achilles tendinitis, left leg: Secondary | ICD-10-CM | POA: Diagnosis not present

## 2018-10-15 ENCOUNTER — Ambulatory Visit: Payer: BLUE CROSS/BLUE SHIELD | Admitting: Sports Medicine

## 2018-10-15 ENCOUNTER — Other Ambulatory Visit: Payer: Self-pay | Admitting: Sports Medicine

## 2018-10-15 ENCOUNTER — Telehealth: Payer: Self-pay | Admitting: *Deleted

## 2018-10-15 DIAGNOSIS — M25571 Pain in right ankle and joints of right foot: Secondary | ICD-10-CM

## 2018-10-15 DIAGNOSIS — M25572 Pain in left ankle and joints of left foot: Secondary | ICD-10-CM

## 2018-10-15 MED ORDER — OXYCODONE-ACETAMINOPHEN 5-325 MG PO TABS: 1.0000 | ORAL_TABLET | Freq: Three times a day (TID) | ORAL | 0 refills | Status: AC | PRN

## 2018-10-15 NOTE — Progress Notes (Signed)
Refilled Percocet this once for severe ankle pain. Patient to follow up as scheduled -Dr. Cannon Kettle

## 2018-10-15 NOTE — Telephone Encounter (Signed)
Notified patient of one time refill sent

## 2018-10-15 NOTE — Telephone Encounter (Signed)
Refilled for her this time only -Dr. Chauncey Cruel

## 2018-10-15 NOTE — Telephone Encounter (Signed)
Which medication is she needing a refill on? I haven't given her medication in a while -Dr. Cannon Kettle

## 2018-10-15 NOTE — Telephone Encounter (Signed)
Patient states for percocet

## 2018-10-15 NOTE — Telephone Encounter (Signed)
Patient states she had pharmacy send a req for pain med refill last week and has not heard anything.  Patient canceled appt for today due to a conflict and will call back to resch.

## 2018-11-03 DIAGNOSIS — Z3009 Encounter for other general counseling and advice on contraception: Secondary | ICD-10-CM | POA: Diagnosis not present

## 2018-11-03 DIAGNOSIS — Z309 Encounter for contraceptive management, unspecified: Secondary | ICD-10-CM | POA: Diagnosis not present

## 2018-11-27 DIAGNOSIS — F419 Anxiety disorder, unspecified: Secondary | ICD-10-CM | POA: Diagnosis not present

## 2018-11-27 DIAGNOSIS — Z713 Dietary counseling and surveillance: Secondary | ICD-10-CM | POA: Diagnosis not present

## 2018-11-27 DIAGNOSIS — G4700 Insomnia, unspecified: Secondary | ICD-10-CM | POA: Diagnosis not present

## 2018-11-27 DIAGNOSIS — Z1331 Encounter for screening for depression: Secondary | ICD-10-CM | POA: Diagnosis not present

## 2018-12-11 ENCOUNTER — Ambulatory Visit (INDEPENDENT_AMBULATORY_CARE_PROVIDER_SITE_OTHER): Payer: BC Managed Care – PPO | Admitting: Sports Medicine

## 2018-12-11 ENCOUNTER — Other Ambulatory Visit: Payer: Self-pay

## 2018-12-11 DIAGNOSIS — M76812 Anterior tibial syndrome, left leg: Secondary | ICD-10-CM

## 2018-12-11 DIAGNOSIS — S93401D Sprain of unspecified ligament of right ankle, subsequent encounter: Secondary | ICD-10-CM | POA: Diagnosis not present

## 2018-12-12 ENCOUNTER — Encounter: Payer: Self-pay | Admitting: Sports Medicine

## 2018-12-12 NOTE — Progress Notes (Signed)
Patient met with medical assistant and was fitted for plantar fascial braces bilateral.  Patient demonstrated proper use and was able to ambulate with braces without any discomfort.  Patient was instructed to return to office or to schedule a follow-up appointment if symptoms fail to improve. -Dr. Cannon Kettle

## 2019-01-19 DIAGNOSIS — Z3009 Encounter for other general counseling and advice on contraception: Secondary | ICD-10-CM | POA: Diagnosis not present

## 2019-01-19 DIAGNOSIS — Z23 Encounter for immunization: Secondary | ICD-10-CM | POA: Diagnosis not present

## 2019-01-19 DIAGNOSIS — Z309 Encounter for contraceptive management, unspecified: Secondary | ICD-10-CM | POA: Diagnosis not present

## 2019-01-21 DIAGNOSIS — Z713 Dietary counseling and surveillance: Secondary | ICD-10-CM | POA: Diagnosis not present

## 2019-01-21 DIAGNOSIS — F419 Anxiety disorder, unspecified: Secondary | ICD-10-CM | POA: Diagnosis not present

## 2019-01-21 DIAGNOSIS — G4700 Insomnia, unspecified: Secondary | ICD-10-CM | POA: Diagnosis not present

## 2019-02-11 ENCOUNTER — Other Ambulatory Visit: Payer: Self-pay | Admitting: Sports Medicine

## 2019-02-11 DIAGNOSIS — M79671 Pain in right foot: Secondary | ICD-10-CM

## 2019-02-12 ENCOUNTER — Ambulatory Visit: Payer: BC Managed Care – PPO | Admitting: Sports Medicine

## 2019-03-03 ENCOUNTER — Telehealth: Payer: Self-pay | Admitting: *Deleted

## 2019-03-03 NOTE — Telephone Encounter (Signed)
Patient called requesting another 6 month handicap sticker.  Please advise.

## 2019-03-03 NOTE — Telephone Encounter (Signed)
Yes we can renew her temp handicap sticker due to orthopedic condition Thanks Dr. Cannon Kettle

## 2019-03-18 DIAGNOSIS — Z713 Dietary counseling and surveillance: Secondary | ICD-10-CM | POA: Diagnosis not present

## 2019-03-18 DIAGNOSIS — G4700 Insomnia, unspecified: Secondary | ICD-10-CM | POA: Diagnosis not present

## 2019-03-18 DIAGNOSIS — F419 Anxiety disorder, unspecified: Secondary | ICD-10-CM | POA: Diagnosis not present

## 2019-03-30 NOTE — Telephone Encounter (Signed)
Pt called asking for another handicap sticker. I just saw that you and melody had the conversation already. So im going to have Dr. March Rummage sign for it ( he said he was ok with this) so she can pick it up today. Thanks.

## 2019-03-31 NOTE — Telephone Encounter (Signed)
Yes. And Thanks! Dr. Chauncey Cruel

## 2019-04-06 DIAGNOSIS — Z309 Encounter for contraceptive management, unspecified: Secondary | ICD-10-CM | POA: Diagnosis not present

## 2019-04-06 DIAGNOSIS — Z3009 Encounter for other general counseling and advice on contraception: Secondary | ICD-10-CM | POA: Diagnosis not present

## 2019-04-28 ENCOUNTER — Other Ambulatory Visit: Payer: Self-pay | Admitting: Physician Assistant

## 2019-04-28 ENCOUNTER — Other Ambulatory Visit: Payer: Self-pay | Admitting: Nurse Practitioner

## 2019-04-28 ENCOUNTER — Other Ambulatory Visit: Payer: Self-pay

## 2019-04-28 DIAGNOSIS — Z1231 Encounter for screening mammogram for malignant neoplasm of breast: Secondary | ICD-10-CM

## 2019-06-02 ENCOUNTER — Telehealth: Payer: Self-pay

## 2019-06-02 ENCOUNTER — Ambulatory Visit: Payer: BC Managed Care – PPO | Admitting: Sports Medicine

## 2019-06-02 ENCOUNTER — Other Ambulatory Visit: Payer: Self-pay

## 2019-06-02 ENCOUNTER — Encounter: Payer: Self-pay | Admitting: Sports Medicine

## 2019-06-02 DIAGNOSIS — M792 Neuralgia and neuritis, unspecified: Secondary | ICD-10-CM

## 2019-06-02 DIAGNOSIS — M898X7 Other specified disorders of bone, ankle and foot: Secondary | ICD-10-CM

## 2019-06-02 DIAGNOSIS — M79671 Pain in right foot: Secondary | ICD-10-CM | POA: Diagnosis not present

## 2019-06-02 DIAGNOSIS — G8929 Other chronic pain: Secondary | ICD-10-CM

## 2019-06-02 DIAGNOSIS — M7661 Achilles tendinitis, right leg: Secondary | ICD-10-CM | POA: Diagnosis not present

## 2019-06-02 MED ORDER — GABAPENTIN 300 MG PO CAPS: 300.0000 mg | ORAL_CAPSULE | Freq: Every day | ORAL | 1 refills | Status: DC

## 2019-06-02 NOTE — Patient Instructions (Signed)
Pre-Operative Instructions  Congratulations, you have decided to take an important step towards improving your quality of life.  You can be assured that the doctors and staff at [facility] will be with you every step of the way.  Here are some important things you should know:  1. Plan to be at the surgery center/hospital at least 1 (one) hour prior to your scheduled time, unless otherwise directed by the surgical center/hospital staff.  You must have a responsible adult accompany you, remain during the surgery and drive you home.  Make sure you have directions to the surgical center/hospital to ensure you arrive on time. 2. If you are having surgery at [facility] or [facility] hospitals, you will need a copy of your medical history and physical form from your family physician within one month prior to the date of surgery. We will give you a form for your primary physician to complete.  3. We make every effort to accommodate the date you request for surgery.  However, there are times where surgery dates or times have to be moved.  We will contact you as soon as possible if a change in schedule is required.   4. No aspirin/ibuprofen for one week before surgery.  If you are on aspirin, any non-steroidal anti-inflammatory medications (Mobic, Aleve, Ibuprofen) should not be taken seven (7) days prior to your surgery.  You make take Tylenol for pain prior to surgery.  5. Medications - If you are taking daily heart and blood pressure medications, seizure, reflux, allergy, asthma, anxiety, pain or diabetes medications, make sure you notify the surgery center/hospital before the day of surgery so they can tell you which medications you should take or avoid the day of surgery. 6. No food or drink after midnight the night before surgery unless directed otherwise by surgical center/hospital staff. 7. No alcoholic beverages 24-hours prior to surgery.  No smoking 24-hours prior or 24-hours after  surgery. 8. Wear loose pants or shorts. They should be loose enough to fit over bandages, boots, and casts. 9. Don't wear slip-on shoes. Sneakers are preferred. 10. Bring your boot with you to the surgery center/hospital.  Also bring crutches or a walker if your physician has prescribed it for you.  If you do not have this equipment, it will be provided for you after surgery. 11. If you have not been contacted by the surgery center/hospital by the day before your surgery, call to confirm the date and time of your surgery. 12. Leave-time from work may vary depending on the type of surgery you have.  Appropriate arrangements should be made prior to surgery with your employer. 13. Prescriptions will be provided immediately following surgery by your doctor.  Fill these as soon as possible after surgery and take the medication as directed. Pain medications will not be refilled on weekends and must be approved by the doctor. 14. Remove nail polish on the operative foot and avoid getting pedicures prior to surgery. 15. Wash the night before surgery.  The night before surgery wash the foot and leg well with water and the antibacterial soap provided. Be sure to pay special attention to beneath the toenails and in between the toes.  Wash for at least three (3) minutes. Rinse thoroughly with water and dry well with a towel.  Perform this wash unless told not to do so by your physician.  Enclosed: 1 Ice pack (please put in freezer the night before surgery)   1 Hibiclens skin cleaner     Pre-op instructions  If you have any questions regarding the instructions, please do not hesitate to call our office.  [place]: [address], [place], [place] [address] -- [phone]  [place]: [address] [place], [place] [address] -- [phone]  [place]: [address], [place], [place] [address] -- [phone]   Website: [email or web address] 

## 2019-06-02 NOTE — Progress Notes (Addendum)
Subjective:  WILEY FLICKER is a 44 y.o. female patient who returns to office to follow up on Right foot heel pain.  Patient reports that the pain is constant 7 out of 10 with swelling and numbness to the posterior aspect of the heel reports that it hurts all the time and she has started to limp and is worried that it may cause her some pain or issues on her left since her right has been hurting so much patient reports that she is at the point where she wants to consider have surgery done and has put it off on the right side long enough.  Patient reports that she has been continuing to take ibuprofen without complete relief.  Review of Systems  All other systems reviewed and are negative.    Patient Active Problem List   Diagnosis Date Noted  . Anxiety 12/31/2016    Current Outpatient Medications on File Prior to Visit  Medication Sig Dispense Refill  . ALPRAZolam (XANAX) 0.5 MG tablet Xanax  0.5 mg daily as needed.    Marland Kitchen escitalopram (LEXAPRO) 20 MG tablet Take 20 mg by mouth daily.    . medroxyPROGESTERone (DEPO-PROVERA) 150 MG/ML injection Inject 150 mg into the muscle every 3 (three) months.    . phentermine (ADIPEX-P) 37.5 MG tablet TAKE 1 TABLET BY MOUTH EVERY MORNING FOR APPETTIE SUPPRESSION    . predniSONE (STERAPRED UNI-PAK 21 TAB) 10 MG (21) TBPK tablet Take as directed 21 tablet 0  . zolpidem (AMBIEN) 10 MG tablet TAKE 1 TABLET BY MOUTH AT BEDTIME AS NEEDED FOR INSOMNIA     Current Facility-Administered Medications on File Prior to Visit  Medication Dose Route Frequency Provider Last Rate Last Admin  . triamcinolone acetonide (KENALOG) 10 MG/ML injection 10 mg  10 mg Other Once Landis Martins, DPM      . triamcinolone acetonide (KENALOG) 10 MG/ML injection 10 mg  10 mg Other Once Landis Martins, DPM      . triamcinolone acetonide (KENALOG) 10 MG/ML injection 10 mg  10 mg Other Once [place], Leilanie Rauda, DPM      . triamcinolone acetonide (KENALOG) 10 MG/ML injection 10 mg  10 mg  Other Once [place], Eleshia Wooley, DPM      . triamcinolone acetonide (KENALOG) 10 MG/ML injection 10 mg  10 mg Other Once Landis Martins, DPM        No Known Allergies  Social History   Socioeconomic History  . Marital status: Married    Spouse name: Not on file  . Number of children: Not on file  . Years of education: Not on file  . Highest education level: Not on file  Occupational History  . Not on file  Tobacco Use  . Smoking status: Never Smoker  . Smokeless tobacco: Never Used  Substance and Sexual Activity  . Alcohol use: Not on file  . Drug use: Not on file  . Sexual activity: Not on file  Other Topics Concern  . Not on file  Social History Narrative  . Not on file   Social Determinants of Health   Financial Resource Strain:   . Difficulty of Paying Living Expenses: Not on file  Food Insecurity:   . Worried About Charity fundraiser in the Last Year: Not on file  . Ran Out of Food in the Last Year: Not on file  Transportation Needs:   . Lack of Transportation (Medical): Not on file  . Lack of Transportation (Non-Medical): Not on file  Physical  Activity:   . Days of Exercise per Week: Not on file  . Minutes of Exercise per Session: Not on file  Stress:   . Feeling of Stress : Not on file  Social Connections:   . Frequency of Communication with Friends and Family: Not on file  . Frequency of Social Gatherings with Friends and Family: Not on file  . Attends Religious Services: Not on file  . Active Member of Clubs or Organizations: Not on file  . Attends Banker Meetings: Not on file  . Marital Status: Not on file    No past surgical history on file.  Family History  Problem Relation Age of Onset  . Breast cancer Mother   . Colon cancer Father   . Heart disease Father     Objective:  General: Alert and oriented x3 in no acute distress  Dermatology: No open lesions bilateral lower extremities, no webspace macerations, resolved ecchymosis,  all nails x 10 are well manicured. Surgical scar well healed on left.   Vascular: Dorsalis Pedis and Posterior Tibial pedal pulses palpable, Capillary Fill Time 3 seconds,(+) pedal hair growth bilateral, no edema bilateral lower extremities, Temperature gradient within normal limits.  Neurology: Gross sensation intact via light touch bilateral. Subjective numbness to right heel.  Musculoskeletal: Mild tenderness with palpation at insertion of the Achilles on right with bony exostosis that is palpable.  Achilles feels intact however there is some mild swelling to the area likely related to chronic inflammation. Strength within normal limits in all groups bilateral with mild guarding on the right due to pain at the Achilles.   Assessment and Plan: Problem List Items Addressed This Visit    None    Visit Diagnoses    Achilles tendinitis, right leg    -  Primary   Chronic heel pain, right       Relevant Medications   gabapentin (NEURONTIN) 300 MG capsule   Retrocalcaneal exostosis       Neuritis          -Complete examination performed -Previous x-rays and MRI reviewed -Discuss treatment plan for tendinitis with exostosis right -Patient opt for surgical management. Consent obtained for right Achilles tendon repair and removal of bone spur with placement of suture anchor. Pre and Post op course explained. Risks, benefits, alternatives explained. No guarantees given or implied. Surgical booking slip submitted and provided patient with Surgical packet and info for [facility]. -We will plan to place patient in a posterior splint initially and then at a later time transition her to a cam boot -Patient to be nonweightbearing and suffers from achilles tendonitis and will have surgery for the tendon and the bone spur. This surgery will impair her ability to perform daily activities including bathing and dressing, a cane or walker or crutch will no suffice to keep her nonweightbearing safely and will no  resolve her mobility issues. A wheelchair will allow the patient to safely perform daily activities. Patient can safely propel the wheelchair and has a family member/caregiver that can provide assistance.  -Discussed with patient expect FMLA/disability for 3 months may possibly work sooner from home/virtually -Recommend rest, ice, elevation PRN and heel lifts as dispensed until time for surgery -Return to office after surgery or sooner if problems or issues arise  Asencion Islam, DPM

## 2019-06-03 ENCOUNTER — Telehealth: Payer: Self-pay

## 2019-06-03 NOTE — Telephone Encounter (Signed)
DOS 06/28/19  CALANEAL OSTECTOMY(POSTERIOR) RT - 76160 REPAIR TENDON RT - 28086  [employer] EFFECTIVE DATE - 04/02/19  PLAN DEDUCTIBLE - $1000 W/$1000 REMAINING  OUT OF POCKET - $3900 W/$3900 REMAINING  In-Network Copay - Not Applicable Coinsurance - 20% Authorization Required - No

## 2019-06-04 ENCOUNTER — Ambulatory Visit
Admission: RE | Admit: 2019-06-04 | Discharge: 2019-06-04 | Disposition: A | Discharge status: Home / Self Care | Payer: BC Managed Care – PPO | Source: Ambulatory Visit | Attending: Nurse Practitioner | Admitting: Nurse Practitioner

## 2019-06-04 ENCOUNTER — Other Ambulatory Visit: Payer: Self-pay

## 2019-06-04 DIAGNOSIS — Z1231 Encounter for screening mammogram for malignant neoplasm of breast: Secondary | ICD-10-CM | POA: Diagnosis not present

## 2019-06-08 ENCOUNTER — Telehealth: Payer: Self-pay | Admitting: Urology

## 2019-06-08 NOTE — Telephone Encounter (Signed)
Pt called and wants to know if she can have her covid shot before her sx or if she needs to wait till after sx. Please advise, Thanks.

## 2019-06-10 ENCOUNTER — Other Ambulatory Visit: Payer: Self-pay | Admitting: Sports Medicine

## 2019-06-10 ENCOUNTER — Telehealth: Payer: Self-pay

## 2019-06-10 DIAGNOSIS — M7661 Achilles tendinitis, right leg: Secondary | ICD-10-CM

## 2019-06-10 NOTE — Progress Notes (Signed)
Rx order for Wheelchair with leg/foot rest lift sent to [facility]

## 2019-06-10 NOTE — Telephone Encounter (Signed)
Patient called and is requesting a wheelchair with leg lift for her surgery on 06/28/2019. She stated she has a knee scooter but feels this would be better for her right after surgery. I told her I would put the request in to Dr. Marylene Land.

## 2019-06-10 NOTE — Telephone Encounter (Signed)
I have sent order to [facility]. They will call her re: Wheelchair.

## 2019-06-22 DIAGNOSIS — Z1152 Encounter for screening for COVID-19: Secondary | ICD-10-CM | POA: Diagnosis not present

## 2019-06-23 DIAGNOSIS — Z3009 Encounter for other general counseling and advice on contraception: Secondary | ICD-10-CM | POA: Diagnosis not present

## 2019-06-23 DIAGNOSIS — Z309 Encounter for contraceptive management, unspecified: Secondary | ICD-10-CM | POA: Diagnosis not present

## 2019-06-25 ENCOUNTER — Ambulatory Visit: Payer: BC Managed Care – PPO | Admitting: Sports Medicine

## 2019-06-25 DIAGNOSIS — Z01818 Encounter for other preprocedural examination: Secondary | ICD-10-CM | POA: Diagnosis not present

## 2019-06-26 ENCOUNTER — Other Ambulatory Visit: Payer: Self-pay | Admitting: Sports Medicine

## 2019-06-26 DIAGNOSIS — Z9889 Other specified postprocedural states: Secondary | ICD-10-CM

## 2019-06-26 NOTE — Progress Notes (Signed)
Post op meds entered 

## 2019-06-28 ENCOUNTER — Encounter: Payer: Self-pay | Admitting: Sports Medicine

## 2019-06-28 DIAGNOSIS — M7732 Calcaneal spur, left foot: Secondary | ICD-10-CM | POA: Diagnosis not present

## 2019-06-28 DIAGNOSIS — M2021 Hallux rigidus, right foot: Secondary | ICD-10-CM | POA: Diagnosis not present

## 2019-06-28 DIAGNOSIS — M25571 Pain in right ankle and joints of right foot: Secondary | ICD-10-CM | POA: Diagnosis not present

## 2019-06-28 DIAGNOSIS — M7661 Achilles tendinitis, right leg: Secondary | ICD-10-CM | POA: Diagnosis not present

## 2019-06-28 DIAGNOSIS — F419 Anxiety disorder, unspecified: Secondary | ICD-10-CM | POA: Diagnosis not present

## 2019-06-28 MED ORDER — DOCUSATE SODIUM 100 MG PO CAPS: 100.0000 mg | ORAL_CAPSULE | Freq: Two times a day (BID) | ORAL | 0 refills | Status: DC

## 2019-06-28 MED ORDER — ASPIRIN EC 325 MG PO TBEC: 325.0000 mg | DELAYED_RELEASE_TABLET | Freq: Every day | ORAL | 0 refills | Status: DC

## 2019-06-28 MED ORDER — AMOXICILLIN-POT CLAVULANATE 875-125 MG PO TABS: 1.0000 | ORAL_TABLET | Freq: Two times a day (BID) | ORAL | 0 refills | Status: DC

## 2019-06-28 MED ORDER — PROMETHAZINE HCL 25 MG PO TABS: 25.0000 mg | ORAL_TABLET | Freq: Three times a day (TID) | ORAL | 0 refills | Status: DC | PRN

## 2019-06-28 MED ORDER — IBUPROFEN 800 MG PO TABS: 800.0000 mg | ORAL_TABLET | Freq: Three times a day (TID) | ORAL | 0 refills | Status: DC | PRN

## 2019-06-28 MED ORDER — HYDROCODONE-ACETAMINOPHEN 10-325 MG PO TABS: 1.0000 | ORAL_TABLET | Freq: Four times a day (QID) | ORAL | 0 refills | Status: AC | PRN

## 2019-06-29 ENCOUNTER — Telehealth: Payer: Self-pay | Admitting: Sports Medicine

## 2019-06-29 NOTE — Telephone Encounter (Signed)
Postoperative check phone call made to patient.  Patient reports that she is doing well has not had any pain her leg is still numb.  Patient reports that she took 2 ibuprofen and 1 hydrocodone at bedtime just in case she needed it but has not had any pain.  Patient reports that there is a little soreness in her throat from the breathing tube but otherwise she is doing okay.  Patient was reminded to continue with rest ice elevation and advised that she should remain nonweightbearing for at least 4 weeks.  Patient also asks when her initial appointment is.  I advised patient that she will be seeing Dr. Samuella Cota on 4/5 at 115.  Patient thanked me for my call. Dr. Marylene Land

## 2019-07-05 ENCOUNTER — Other Ambulatory Visit: Payer: Self-pay | Admitting: Podiatry

## 2019-07-05 ENCOUNTER — Other Ambulatory Visit: Payer: Self-pay

## 2019-07-05 ENCOUNTER — Ambulatory Visit (INDEPENDENT_AMBULATORY_CARE_PROVIDER_SITE_OTHER): Payer: BC Managed Care – PPO

## 2019-07-05 ENCOUNTER — Ambulatory Visit (INDEPENDENT_AMBULATORY_CARE_PROVIDER_SITE_OTHER): Payer: BC Managed Care – PPO | Admitting: Podiatry

## 2019-07-05 DIAGNOSIS — Z9889 Other specified postprocedural states: Secondary | ICD-10-CM

## 2019-07-05 DIAGNOSIS — M7661 Achilles tendinitis, right leg: Secondary | ICD-10-CM

## 2019-07-05 DIAGNOSIS — M898X7 Other specified disorders of bone, ankle and foot: Secondary | ICD-10-CM

## 2019-07-14 ENCOUNTER — Encounter: Payer: Self-pay | Admitting: Sports Medicine

## 2019-07-14 ENCOUNTER — Ambulatory Visit (INDEPENDENT_AMBULATORY_CARE_PROVIDER_SITE_OTHER): Payer: BC Managed Care – PPO | Admitting: Sports Medicine

## 2019-07-14 ENCOUNTER — Other Ambulatory Visit: Payer: Self-pay

## 2019-07-14 DIAGNOSIS — Z9889 Other specified postprocedural states: Secondary | ICD-10-CM

## 2019-07-14 DIAGNOSIS — M7661 Achilles tendinitis, right leg: Secondary | ICD-10-CM

## 2019-07-14 DIAGNOSIS — G8929 Other chronic pain: Secondary | ICD-10-CM

## 2019-07-14 DIAGNOSIS — M79671 Pain in right foot: Secondary | ICD-10-CM

## 2019-07-14 DIAGNOSIS — M898X7 Other specified disorders of bone, ankle and foot: Secondary | ICD-10-CM

## 2019-07-14 DIAGNOSIS — M792 Neuralgia and neuritis, unspecified: Secondary | ICD-10-CM

## 2019-07-14 MED ORDER — PROMETHAZINE HCL 25 MG PO TABS: 25.0000 mg | ORAL_TABLET | Freq: Three times a day (TID) | ORAL | 0 refills | Status: DC | PRN

## 2019-07-14 NOTE — Progress Notes (Signed)
Subjective: Mercedes Kline is a 44 y.o. female patient seen today in office for POV #2 (DOS 06/28/2019), S/P right Achilles tendon repair and calcaneal ostectomy. Patient admits to a little pain at worst 4 out of 10 occasionally to the surgical site reports that in the cast he feels like something is rubbing the back of her heel had one episode of nausea but otherwise is doing okay does admit to some tingling but denies numbness and reports that her hydrocodone medication has helped with pain.  Patient denies calf pain, denies headache, chest pain, shortness of breath, nausea, vomiting, fever, or chills.  No other issues noted.  Patient Active Problem List   Diagnosis Date Noted  . Anxiety 12/31/2016    Current Outpatient Medications on File Prior to Visit  Medication Sig Dispense Refill  . ALPRAZolam (XANAX) 0.5 MG tablet Xanax  0.5 mg daily as needed.    Marland Kitchen amoxicillin-clavulanate (AUGMENTIN) 875-125 MG tablet Take 1 tablet by mouth 2 (two) times daily. 28 tablet 0  . aspirin EC 325 MG tablet Take 1 tablet (325 mg total) by mouth daily. To prevent against blood clot while nonweightbearing 30 tablet 0  . docusate sodium (COLACE) 100 MG capsule Take 1 capsule (100 mg total) by mouth 2 (two) times daily. 10 capsule 0  . escitalopram (LEXAPRO) 20 MG tablet Take 20 mg by mouth daily.    Marland Kitchen gabapentin (NEURONTIN) 300 MG capsule Take 1 capsule (300 mg total) by mouth at bedtime. 90 capsule 1  . ibuprofen (ADVIL) 800 MG tablet Take 1 tablet (800 mg total) by mouth every 8 (eight) hours as needed. 30 tablet 0  . medroxyPROGESTERone (DEPO-PROVERA) 150 MG/ML injection Inject 150 mg into the muscle every 3 (three) months.    . phentermine (ADIPEX-P) 37.5 MG tablet TAKE 1 TABLET BY MOUTH EVERY MORNING FOR APPETTIE SUPPRESSION    . predniSONE (STERAPRED UNI-PAK 21 TAB) 10 MG (21) TBPK tablet Take as directed 21 tablet 0  . zolpidem (AMBIEN) 10 MG tablet TAKE 1 TABLET BY MOUTH AT BEDTIME AS NEEDED FOR INSOMNIA      Current Facility-Administered Medications on File Prior to Visit  Medication Dose Route Frequency Provider Last Rate Last Admin  . triamcinolone acetonide (KENALOG) 10 MG/ML injection 10 mg  10 mg Other Once Asencion Islam, DPM      . triamcinolone acetonide (KENALOG) 10 MG/ML injection 10 mg  10 mg Other Once Asencion Islam, DPM      . triamcinolone acetonide (KENALOG) 10 MG/ML injection 10 mg  10 mg Other Once Asencion Islam, DPM      . triamcinolone acetonide (KENALOG) 10 MG/ML injection 10 mg  10 mg Other Once [place], Tequilla Cousineau, DPM      . triamcinolone acetonide (KENALOG) 10 MG/ML injection 10 mg  10 mg Other Once Asencion Islam, DPM        No Known Allergies  Objective: There were no vitals filed for this visit.  General: No acute distress, AAOx3  Right foot: Sutures intact with no gapping or dehiscence there is mild loosening of staple noted at the inferior aspect of the incision, minimal edema, no erythema, no warmth or redness or acute signs of infection, capillary fill time <3 seconds in all digits, gross sensation present via light touch to right foot.  Mild guarding but no gross pain with range of motion right foot.  No pain with calf compression.   Assessment and Plan:  Problem List Items Addressed This Visit  None    Visit Diagnoses    S/P foot surgery, right    -  Primary   Retrocalcaneal exostosis       Achilles tendinitis, right leg       Chronic heel pain, right       Neuritis           -Patient seen and evaluated -X-rays reviewed from last visit -Fiberglas cast removed -Applied Unna boot and dry sterile dressing to surgical site right foot secured with ACE wrap and stockinet  -Advised patient to make sure to keep dressings clean, dry, and intact to right surgical site -Advised patient to continue with nonweightbearing with use of knee scooter or wheelchair -Advised patient to continue with rest ice elevation -Continue with hydrocodone and ibuprofen as  needed for pain -Refilled Phenergan for nausea -Will plan for Unna boot and suture removal at next office visit. In the meantime, patient to call office if any issues or problems arise.  Advised patient at this time refrain from work likely patient may be able to go back the week of 426 with restrictions of light/desk duty and elevation while sitting.  Landis Martins, DPM

## 2019-07-15 ENCOUNTER — Telehealth: Payer: Self-pay | Admitting: Podiatry

## 2019-07-15 NOTE — Telephone Encounter (Signed)
This patient calls to the office to state that she had an Unna boot applied yesterday by Dr. Marylene Land and she got her Unna boot and Ace bandage wet despite wearing a bag to cover her cast.  Dr. Marylene Land had performed surgery on 06/28/2019 in the posterior aspect of her foot.  Dr. Marylene Land saw this patient in the office and applied an Unna boot to her surgical site.  Patient  was to wear this Unna boot until she returned to the office at which time sutures would  be removed.  Since this patient stated that the Unna boot and Ace bandage were both wet I told her to remove the cast and to call the office in the morning for reapplication of a bandage and a cast at the surgical site.  I was concerned maceration could occur at the surgical site that could lead to an infection to the surgical site.   Helane Gunther DPM

## 2019-07-16 ENCOUNTER — Other Ambulatory Visit: Payer: Self-pay

## 2019-07-16 ENCOUNTER — Encounter: Payer: Self-pay | Admitting: Sports Medicine

## 2019-07-16 ENCOUNTER — Ambulatory Visit (INDEPENDENT_AMBULATORY_CARE_PROVIDER_SITE_OTHER): Payer: BC Managed Care – PPO | Admitting: Sports Medicine

## 2019-07-16 ENCOUNTER — Other Ambulatory Visit: Payer: Self-pay | Admitting: Podiatry

## 2019-07-16 DIAGNOSIS — M898X7 Other specified disorders of bone, ankle and foot: Secondary | ICD-10-CM

## 2019-07-16 DIAGNOSIS — G8929 Other chronic pain: Secondary | ICD-10-CM

## 2019-07-16 DIAGNOSIS — Z9889 Other specified postprocedural states: Secondary | ICD-10-CM

## 2019-07-16 DIAGNOSIS — M79671 Pain in right foot: Secondary | ICD-10-CM

## 2019-07-16 DIAGNOSIS — M7661 Achilles tendinitis, right leg: Secondary | ICD-10-CM

## 2019-07-16 DIAGNOSIS — M792 Neuralgia and neuritis, unspecified: Secondary | ICD-10-CM

## 2019-07-16 NOTE — Progress Notes (Signed)
Patient seen by CMA today for dressing change. Her soft cast got wet with her shower. CMA changed dressing and re-applied unna boot with instructions to keep if clean and dry. Patient to follow up as scheduled on next week. -Dr. Marylene Land

## 2019-07-22 ENCOUNTER — Encounter: Payer: Self-pay | Admitting: Sports Medicine

## 2019-07-22 ENCOUNTER — Ambulatory Visit (INDEPENDENT_AMBULATORY_CARE_PROVIDER_SITE_OTHER): Payer: BC Managed Care – PPO | Admitting: Sports Medicine

## 2019-07-22 ENCOUNTER — Other Ambulatory Visit: Payer: Self-pay

## 2019-07-22 DIAGNOSIS — M7661 Achilles tendinitis, right leg: Secondary | ICD-10-CM

## 2019-07-22 DIAGNOSIS — Z9889 Other specified postprocedural states: Secondary | ICD-10-CM

## 2019-07-22 DIAGNOSIS — M898X7 Other specified disorders of bone, ankle and foot: Secondary | ICD-10-CM

## 2019-07-22 DIAGNOSIS — M79671 Pain in right foot: Secondary | ICD-10-CM

## 2019-07-22 NOTE — Progress Notes (Signed)
Subjective: Mercedes Kline is a 44 y.o. female patient seen today in office for POV #3 (DOS 06/28/2019), S/P right Achilles tendon repair and calcaneal ostectomy. Patient reports that she is doing good. Wondering does she still need to take Aspirin.  Patient denies calf pain, denies headache, chest pain, shortness of breath, nausea, vomiting, fever, or chills.  No other issues noted.  Patient Active Problem List   Diagnosis Date Noted  . Anxiety 12/31/2016    Current Outpatient Medications on File Prior to Visit  Medication Sig Dispense Refill  . ALPRAZolam (XANAX) 0.5 MG tablet Xanax  0.5 mg daily as needed.    Marland Kitchen amoxicillin-clavulanate (AUGMENTIN) 875-125 MG tablet Take 1 tablet by mouth 2 (two) times daily. 28 tablet 0  . aspirin EC 325 MG tablet Take 1 tablet (325 mg total) by mouth daily. To prevent against blood clot while nonweightbearing 30 tablet 0  . docusate sodium (COLACE) 100 MG capsule Take 1 capsule (100 mg total) by mouth 2 (two) times daily. 10 capsule 0  . escitalopram (LEXAPRO) 20 MG tablet Take 20 mg by mouth daily.    Marland Kitchen gabapentin (NEURONTIN) 300 MG capsule Take 1 capsule (300 mg total) by mouth at bedtime. 90 capsule 1  . ibuprofen (ADVIL) 800 MG tablet Take 1 tablet (800 mg total) by mouth every 8 (eight) hours as needed. 30 tablet 0  . medroxyPROGESTERone (DEPO-PROVERA) 150 MG/ML injection Inject 150 mg into the muscle every 3 (three) months.    . phentermine (ADIPEX-P) 37.5 MG tablet TAKE 1 TABLET BY MOUTH EVERY MORNING FOR APPETTIE SUPPRESSION    . predniSONE (STERAPRED UNI-PAK 21 TAB) 10 MG (21) TBPK tablet Take as directed 21 tablet 0  . promethazine (PHENERGAN) 25 MG tablet Take 1 tablet (25 mg total) by mouth every 8 (eight) hours as needed for nausea or vomiting. 20 tablet 0  . zolpidem (AMBIEN) 10 MG tablet TAKE 1 TABLET BY MOUTH AT BEDTIME AS NEEDED FOR INSOMNIA     Current Facility-Administered Medications on File Prior to Visit  Medication Dose Route  Frequency Provider Last Rate Last Admin  . triamcinolone acetonide (KENALOG) 10 MG/ML injection 10 mg  10 mg Other Once Landis Martins, DPM      . triamcinolone acetonide (KENALOG) 10 MG/ML injection 10 mg  10 mg Other Once Landis Martins, DPM      . triamcinolone acetonide (KENALOG) 10 MG/ML injection 10 mg  10 mg Other Once Landis Martins, DPM      . triamcinolone acetonide (KENALOG) 10 MG/ML injection 10 mg  10 mg Other Once [place], Deveon Kisiel, DPM      . triamcinolone acetonide (KENALOG) 10 MG/ML injection 10 mg  10 mg Other Once Landis Martins, DPM        No Known Allergies  Objective: There were no vitals filed for this visit.  General: No acute distress, AAOx3  Right foot: Sutures intact with no gapping or dehiscence there is mild loosening of staple noted at the inferior aspect of the incision in a few places, minimal edema, no erythema, no warmth or redness or acute signs of infection, capillary fill time <3 seconds in all digits, gross sensation present via light touch to right foot.  Mild guarding but no gross pain with range of motion right foot.  No pain with calf compression.   Assessment and Plan:  Problem List Items Addressed This Visit    None    Visit Diagnoses    S/P foot surgery, right    -  Primary   Retrocalcaneal exostosis       Achilles tendinitis, right leg       Pain of right heel           -Patient seen and evaluated -Unna boot removed -Every other staple and all sutures were removed -Applied dry sterile dressing to surgical site right foot secured with ACE wrap and stockinet  -Advised patient to make sure to keep dressings clean, dry, and intact to right surgical site -Advised patient to continue with nonweightbearing with use of knee scooter or wheelchair -Continue Aspirin while NWB -Advised patient to continue with rest ice elevation -Return in 1 week to finish staple removal -Anticipate return to work 08/02/19 desk duty max 4-5 hours per  shift. Asencion Islam, DPM

## 2019-07-26 DIAGNOSIS — Z01818 Encounter for other preprocedural examination: Secondary | ICD-10-CM | POA: Diagnosis not present

## 2019-07-28 ENCOUNTER — Encounter: Payer: Self-pay | Admitting: Sports Medicine

## 2019-07-28 ENCOUNTER — Other Ambulatory Visit: Payer: Self-pay

## 2019-07-28 ENCOUNTER — Ambulatory Visit (INDEPENDENT_AMBULATORY_CARE_PROVIDER_SITE_OTHER): Payer: BC Managed Care – PPO | Admitting: Sports Medicine

## 2019-07-28 DIAGNOSIS — M898X7 Other specified disorders of bone, ankle and foot: Secondary | ICD-10-CM

## 2019-07-28 DIAGNOSIS — M79671 Pain in right foot: Secondary | ICD-10-CM

## 2019-07-28 DIAGNOSIS — Z9889 Other specified postprocedural states: Secondary | ICD-10-CM

## 2019-07-28 DIAGNOSIS — M7661 Achilles tendinitis, right leg: Secondary | ICD-10-CM

## 2019-07-28 NOTE — Progress Notes (Signed)
Subjective: Mercedes Kline is a 44 y.o. female patient seen today in office for POV #4 (DOS 06/28/2019), S/P right Achilles tendon repair and calcaneal ostectomy. Patient reports that she is doing fine but did have to change the outer dressings that came off.  Denies calf pain, chest pain, shortness of breath, nausea, vomiting, fever, or chills.  No other issues noted.  Patient Active Problem List   Diagnosis Date Noted  . Anxiety 12/31/2016    Current Outpatient Medications on File Prior to Visit  Medication Sig Dispense Refill  . ALPRAZolam (XANAX) 0.5 MG tablet Xanax  0.5 mg daily as needed.    Marland Kitchen amoxicillin-clavulanate (AUGMENTIN) 875-125 MG tablet Take 1 tablet by mouth 2 (two) times daily. 28 tablet 0  . aspirin EC 325 MG tablet Take 1 tablet (325 mg total) by mouth daily. To prevent against blood clot while nonweightbearing 30 tablet 0  . docusate sodium (COLACE) 100 MG capsule Take 1 capsule (100 mg total) by mouth 2 (two) times daily. 10 capsule 0  . escitalopram (LEXAPRO) 20 MG tablet Take 20 mg by mouth daily.    Marland Kitchen gabapentin (NEURONTIN) 300 MG capsule Take 1 capsule (300 mg total) by mouth at bedtime. 90 capsule 1  . ibuprofen (ADVIL) 800 MG tablet Take 1 tablet (800 mg total) by mouth every 8 (eight) hours as needed. 30 tablet 0  . medroxyPROGESTERone (DEPO-PROVERA) 150 MG/ML injection Inject 150 mg into the muscle every 3 (three) months.    . phentermine (ADIPEX-P) 37.5 MG tablet TAKE 1 TABLET BY MOUTH EVERY MORNING FOR APPETTIE SUPPRESSION    . predniSONE (STERAPRED UNI-PAK 21 TAB) 10 MG (21) TBPK tablet Take as directed 21 tablet 0  . promethazine (PHENERGAN) 25 MG tablet Take 1 tablet (25 mg total) by mouth every 8 (eight) hours as needed for nausea or vomiting. 20 tablet 0  . zolpidem (AMBIEN) 10 MG tablet TAKE 1 TABLET BY MOUTH AT BEDTIME AS NEEDED FOR INSOMNIA     Current Facility-Administered Medications on File Prior to Visit  Medication Dose Route Frequency Provider  Last Rate Last Admin  . triamcinolone acetonide (KENALOG) 10 MG/ML injection 10 mg  10 mg Other Once Landis Martins, DPM      . triamcinolone acetonide (KENALOG) 10 MG/ML injection 10 mg  10 mg Other Once Landis Martins, DPM      . triamcinolone acetonide (KENALOG) 10 MG/ML injection 10 mg  10 mg Other Once Landis Martins, DPM      . triamcinolone acetonide (KENALOG) 10 MG/ML injection 10 mg  10 mg Other Once [place], Krystie Leiter, DPM      . triamcinolone acetonide (KENALOG) 10 MG/ML injection 10 mg  10 mg Other Once Landis Martins, DPM        No Known Allergies  Objective: There were no vitals filed for this visit.  General: No acute distress, AAOx3  Right foot: Remaining staples intact, minimal edema, no erythema, no warmth or redness or acute signs of infection, capillary fill time <3 seconds in all digits, gross sensation present via light touch to right foot.  No pain with range of motion right foot.  No pain with calf compression.   Assessment and Plan:  Problem List Items Addressed This Visit    None    Visit Diagnoses    S/P foot surgery, right    -  Primary   Retrocalcaneal exostosis       Achilles tendinitis, right leg       Pain  of right heel           -Patient seen and evaluated -Staples removed -Applied dry sterile dressing to surgical site right foot secured with ACE wrap and stockinet  -Advised patient may shower on tomorrow and to allow steristrips to fall off -Advised patient to continue with nonweightbearing with use of knee scooter or wheelchair -Continue Aspirin while NWB until next office visit  -Advised patient to continue with rest ice elevation -Anticipate return to work 08/02/19 desk duty max 4-5 hours per shift. -Return to work in 2 weeks for Post op check and xrays. May allow weightbearing with CAM boot at next visit. Asencion Islam, DPM

## 2019-08-12 ENCOUNTER — Other Ambulatory Visit: Payer: Self-pay

## 2019-08-12 ENCOUNTER — Ambulatory Visit (INDEPENDENT_AMBULATORY_CARE_PROVIDER_SITE_OTHER): Payer: BC Managed Care – PPO | Admitting: Sports Medicine

## 2019-08-12 ENCOUNTER — Encounter: Payer: Self-pay | Admitting: Sports Medicine

## 2019-08-12 ENCOUNTER — Other Ambulatory Visit: Payer: Self-pay | Admitting: Sports Medicine

## 2019-08-12 ENCOUNTER — Ambulatory Visit (INDEPENDENT_AMBULATORY_CARE_PROVIDER_SITE_OTHER): Payer: BC Managed Care – PPO

## 2019-08-12 DIAGNOSIS — M79671 Pain in right foot: Secondary | ICD-10-CM

## 2019-08-12 DIAGNOSIS — Z9841 Cataract extraction status, right eye: Secondary | ICD-10-CM

## 2019-08-12 DIAGNOSIS — M7661 Achilles tendinitis, right leg: Secondary | ICD-10-CM

## 2019-08-12 DIAGNOSIS — Z9889 Other specified postprocedural states: Secondary | ICD-10-CM

## 2019-08-12 DIAGNOSIS — M898X7 Other specified disorders of bone, ankle and foot: Secondary | ICD-10-CM

## 2019-08-12 NOTE — Progress Notes (Signed)
Subjective: Mercedes Kline is a 44 y.o. female patient seen today in office for POV # 5 (DOS 06/28/2019), S/P right Achilles tendon repair and calcaneal ostectomy. Patient reports that she is good with no pain or any issues.  Denies calf pain, chest pain, shortness of breath, nausea, vomiting, fever, or chills.  No other pedal complaints noted Patient Active Problem List   Diagnosis Date Noted  . Anxiety 12/31/2016    Current Outpatient Medications on File Prior to Visit  Medication Sig Dispense Refill  . ALPRAZolam (XANAX) 0.5 MG tablet Xanax  0.5 mg daily as needed.    Marland Kitchen amoxicillin-clavulanate (AUGMENTIN) 875-125 MG tablet Take 1 tablet by mouth 2 (two) times daily. 28 tablet 0  . aspirin EC 325 MG tablet Take 1 tablet (325 mg total) by mouth daily. To prevent against blood clot while nonweightbearing 30 tablet 0  . docusate sodium (COLACE) 100 MG capsule Take 1 capsule (100 mg total) by mouth 2 (two) times daily. 10 capsule 0  . escitalopram (LEXAPRO) 20 MG tablet Take 20 mg by mouth daily.    Marland Kitchen gabapentin (NEURONTIN) 300 MG capsule Take 1 capsule (300 mg total) by mouth at bedtime. 90 capsule 1  . ibuprofen (ADVIL) 800 MG tablet Take 1 tablet (800 mg total) by mouth every 8 (eight) hours as needed. 30 tablet 0  . medroxyPROGESTERone (DEPO-PROVERA) 150 MG/ML injection Inject 150 mg into the muscle every 3 (three) months.    . phentermine (ADIPEX-P) 37.5 MG tablet TAKE 1 TABLET BY MOUTH EVERY MORNING FOR APPETTIE SUPPRESSION    . predniSONE (STERAPRED UNI-PAK 21 TAB) 10 MG (21) TBPK tablet Take as directed 21 tablet 0  . promethazine (PHENERGAN) 25 MG tablet Take 1 tablet (25 mg total) by mouth every 8 (eight) hours as needed for nausea or vomiting. 20 tablet 0  . zolpidem (AMBIEN) 10 MG tablet TAKE 1 TABLET BY MOUTH AT BEDTIME AS NEEDED FOR INSOMNIA     Current Facility-Administered Medications on File Prior to Visit  Medication Dose Route Frequency Provider Last Rate Last Admin  .  triamcinolone acetonide (KENALOG) 10 MG/ML injection 10 mg  10 mg Other Once Asencion Islam, DPM      . triamcinolone acetonide (KENALOG) 10 MG/ML injection 10 mg  10 mg Other Once Asencion Islam, DPM      . triamcinolone acetonide (KENALOG) 10 MG/ML injection 10 mg  10 mg Other Once Asencion Islam, DPM      . triamcinolone acetonide (KENALOG) 10 MG/ML injection 10 mg  10 mg Other Once [place], Luree Palla, DPM      . triamcinolone acetonide (KENALOG) 10 MG/ML injection 10 mg  10 mg Other Once Asencion Islam, DPM        No Known Allergies  Objective: There were no vitals filed for this visit.  General: No acute distress, AAOx3  Right foot: Incision well-healed, minimal edema, no erythema, no warmth or redness or acute signs of infection, capillary fill time <3 seconds in all digits, gross sensation present via light touch to right foot.  No pain with range of motion right foot.  No pain with calf compression.   X-rays consistent with postoperative status  Assessment and Plan:  Problem List Items Addressed This Visit    None    Visit Diagnoses    S/P foot surgery, right    -  Primary   Retrocalcaneal exostosis       Achilles tendinitis, right leg       Pain  of right heel           -Patient seen and evaluated -Incision well healed -Encouraged range of motion exercises -Encourage scar creams or gels -Patient may weight-bear with cam boot -Advised patient to continue with rest ice elevation as needed -Continue with current work restrictions until 10/03/19 of next work time 4 to 5 hours light/desk duty only -Disability paperwork completed at today's visit -Return to office in 2 weeks, if she is doing well we will plan to allow transition out of CAM boot at next office visit. Landis Martins, DPM

## 2019-08-25 DIAGNOSIS — Z01818 Encounter for other preprocedural examination: Secondary | ICD-10-CM | POA: Diagnosis not present

## 2019-08-27 ENCOUNTER — Ambulatory Visit (INDEPENDENT_AMBULATORY_CARE_PROVIDER_SITE_OTHER): Payer: BC Managed Care – PPO | Admitting: Sports Medicine

## 2019-08-27 ENCOUNTER — Encounter: Payer: Self-pay | Admitting: Sports Medicine

## 2019-08-27 ENCOUNTER — Other Ambulatory Visit: Payer: Self-pay

## 2019-08-27 DIAGNOSIS — M898X7 Other specified disorders of bone, ankle and foot: Secondary | ICD-10-CM

## 2019-08-27 DIAGNOSIS — Z9889 Other specified postprocedural states: Secondary | ICD-10-CM

## 2019-08-27 DIAGNOSIS — M79671 Pain in right foot: Secondary | ICD-10-CM

## 2019-08-27 DIAGNOSIS — M7661 Achilles tendinitis, right leg: Secondary | ICD-10-CM

## 2019-08-27 NOTE — Progress Notes (Signed)
Subjective: Mercedes Kline is a 44 y.o. female patient seen today in office for POV # 6 (DOS 06/28/2019), S/P right Achilles tendon repair and calcaneal ostectomy. Patient reports that she has a little pain when walking with the cam boot 4 out of 10 but after she walks a little bit the pain goes away.  Denies calf pain, chest pain, shortness of breath, nausea, vomiting, fever, or chills.  No other pedal complaints noted.  Patient Active Problem List   Diagnosis Date Noted  . Anxiety 12/31/2016    Current Outpatient Medications on File Prior to Visit  Medication Sig Dispense Refill  . ALPRAZolam (XANAX) 0.5 MG tablet Xanax  0.5 mg daily as needed.    Marland Kitchen amoxicillin-clavulanate (AUGMENTIN) 875-125 MG tablet Take 1 tablet by mouth 2 (two) times daily. 28 tablet 0  . aspirin EC 325 MG tablet Take 1 tablet (325 mg total) by mouth daily. To prevent against blood clot while nonweightbearing 30 tablet 0  . docusate sodium (COLACE) 100 MG capsule Take 1 capsule (100 mg total) by mouth 2 (two) times daily. 10 capsule 0  . escitalopram (LEXAPRO) 20 MG tablet Take 20 mg by mouth daily.    Marland Kitchen gabapentin (NEURONTIN) 300 MG capsule Take 1 capsule (300 mg total) by mouth at bedtime. 90 capsule 1  . ibuprofen (ADVIL) 800 MG tablet Take 1 tablet (800 mg total) by mouth every 8 (eight) hours as needed. 30 tablet 0  . medroxyPROGESTERone (DEPO-PROVERA) 150 MG/ML injection Inject 150 mg into the muscle every 3 (three) months.    . phentermine (ADIPEX-P) 37.5 MG tablet TAKE 1 TABLET BY MOUTH EVERY MORNING FOR APPETTIE SUPPRESSION    . predniSONE (STERAPRED UNI-PAK 21 TAB) 10 MG (21) TBPK tablet Take as directed 21 tablet 0  . promethazine (PHENERGAN) 25 MG tablet Take 1 tablet (25 mg total) by mouth every 8 (eight) hours as needed for nausea or vomiting. 20 tablet 0  . zolpidem (AMBIEN) 10 MG tablet TAKE 1 TABLET BY MOUTH AT BEDTIME AS NEEDED FOR INSOMNIA     Current Facility-Administered Medications on File Prior  to Visit  Medication Dose Route Frequency Provider Last Rate Last Admin  . triamcinolone acetonide (KENALOG) 10 MG/ML injection 10 mg  10 mg Other Once Asencion Islam, DPM      . triamcinolone acetonide (KENALOG) 10 MG/ML injection 10 mg  10 mg Other Once Asencion Islam, DPM      . triamcinolone acetonide (KENALOG) 10 MG/ML injection 10 mg  10 mg Other Once Asencion Islam, DPM      . triamcinolone acetonide (KENALOG) 10 MG/ML injection 10 mg  10 mg Other Once [place], Noa Constante, DPM      . triamcinolone acetonide (KENALOG) 10 MG/ML injection 10 mg  10 mg Other Once Asencion Islam, DPM        No Known Allergies  Objective: There were no vitals filed for this visit.  General: No acute distress, AAOx3  Right foot: Incision well-healed, with mild hypertrophy and scar, minimal edema, no erythema, no warmth or redness or acute signs of infection, capillary fill time <3 seconds in all digits, gross sensation present via light touch to right foot.  No pain with range of motion right foot.  No pain with calf compression.  Assessment and Plan:  Problem List Items Addressed This Visit    None    Visit Diagnoses    S/P foot surgery, right    -  Primary   Retrocalcaneal exostosis  Achilles tendinitis, right leg       Pain of right heel           -Patient seen and evaluated -Incision remains well healed -Encouraged range of motion exercises -Encourage scar creams or gels like previous -Patient may transition slowly out of cam boot to tennis shoe with heel lift -Advised patient to continue with rest ice elevation as needed and may use compression as needed for edema control -Continue with current work restrictions until 10/03/19 of next work time 4 to 5 hours light/desk duty only like previous -Return to office in 3 weeks, for follow-up evaluation and final x-rays. Landis Martins, DPM

## 2019-09-14 DIAGNOSIS — E7800 Pure hypercholesterolemia, unspecified: Secondary | ICD-10-CM | POA: Diagnosis not present

## 2019-09-14 DIAGNOSIS — Z0000 Encounter for general adult medical examination without abnormal findings: Secondary | ICD-10-CM | POA: Diagnosis not present

## 2019-09-14 DIAGNOSIS — E038 Other specified hypothyroidism: Secondary | ICD-10-CM | POA: Diagnosis not present

## 2019-09-14 DIAGNOSIS — E039 Hypothyroidism, unspecified: Secondary | ICD-10-CM | POA: Diagnosis not present

## 2019-09-14 DIAGNOSIS — E119 Type 2 diabetes mellitus without complications: Secondary | ICD-10-CM | POA: Diagnosis not present

## 2019-09-14 DIAGNOSIS — Z139 Encounter for screening, unspecified: Secondary | ICD-10-CM | POA: Diagnosis not present

## 2019-09-15 ENCOUNTER — Ambulatory Visit (INDEPENDENT_AMBULATORY_CARE_PROVIDER_SITE_OTHER): Payer: BC Managed Care – PPO | Admitting: Sports Medicine

## 2019-09-15 ENCOUNTER — Other Ambulatory Visit: Payer: Self-pay

## 2019-09-15 ENCOUNTER — Other Ambulatory Visit: Payer: Self-pay | Admitting: Sports Medicine

## 2019-09-15 ENCOUNTER — Ambulatory Visit (INDEPENDENT_AMBULATORY_CARE_PROVIDER_SITE_OTHER): Payer: BC Managed Care – PPO

## 2019-09-15 DIAGNOSIS — M7661 Achilles tendinitis, right leg: Secondary | ICD-10-CM | POA: Diagnosis not present

## 2019-09-15 DIAGNOSIS — Z9889 Other specified postprocedural states: Secondary | ICD-10-CM

## 2019-09-15 DIAGNOSIS — M898X7 Other specified disorders of bone, ankle and foot: Secondary | ICD-10-CM

## 2019-09-15 DIAGNOSIS — M79671 Pain in right foot: Secondary | ICD-10-CM

## 2019-09-15 NOTE — Progress Notes (Signed)
Subjective: Mercedes Kline is a 44 y.o. female patient seen today in office for POV # 7 (DOS 06/28/2019), S/P right Achilles tendon repair and calcaneal ostectomy. Patient reports that she is doing ok still a little sore 3/10 with swelling, using motrin and elevating, Denies calf pain, chest pain, shortness of breath, nausea, vomiting, fever, or chills.  No other pedal complaints noted.  Patient Active Problem List   Diagnosis Date Noted  . Anxiety 12/31/2016    Current Outpatient Medications on File Prior to Visit  Medication Sig Dispense Refill  . ALPRAZolam (XANAX) 0.5 MG tablet Xanax  0.5 mg daily as needed.    Marland Kitchen amoxicillin-clavulanate (AUGMENTIN) 875-125 MG tablet Take 1 tablet by mouth 2 (two) times daily. 28 tablet 0  . aspirin EC 325 MG tablet Take 1 tablet (325 mg total) by mouth daily. To prevent against blood clot while nonweightbearing 30 tablet 0  . docusate sodium (COLACE) 100 MG capsule Take 1 capsule (100 mg total) by mouth 2 (two) times daily. 10 capsule 0  . escitalopram (LEXAPRO) 20 MG tablet Take 20 mg by mouth daily.    Marland Kitchen gabapentin (NEURONTIN) 300 MG capsule Take 1 capsule (300 mg total) by mouth at bedtime. 90 capsule 1  . ibuprofen (ADVIL) 800 MG tablet Take 1 tablet (800 mg total) by mouth every 8 (eight) hours as needed. 30 tablet 0  . medroxyPROGESTERone (DEPO-PROVERA) 150 MG/ML injection Inject 150 mg into the muscle every 3 (three) months.    . phentermine (ADIPEX-P) 37.5 MG tablet TAKE 1 TABLET BY MOUTH EVERY MORNING FOR APPETTIE SUPPRESSION    . predniSONE (STERAPRED UNI-PAK 21 TAB) 10 MG (21) TBPK tablet Take as directed 21 tablet 0  . promethazine (PHENERGAN) 25 MG tablet Take 1 tablet (25 mg total) by mouth every 8 (eight) hours as needed for nausea or vomiting. 20 tablet 0  . zolpidem (AMBIEN) 10 MG tablet TAKE 1 TABLET BY MOUTH AT BEDTIME AS NEEDED FOR INSOMNIA     Current Facility-Administered Medications on File Prior to Visit  Medication Dose Route  Frequency Provider Last Rate Last Admin  . triamcinolone acetonide (KENALOG) 10 MG/ML injection 10 mg  10 mg Other Once Landis Martins, DPM      . triamcinolone acetonide (KENALOG) 10 MG/ML injection 10 mg  10 mg Other Once Landis Martins, DPM      . triamcinolone acetonide (KENALOG) 10 MG/ML injection 10 mg  10 mg Other Once Landis Martins, DPM      . triamcinolone acetonide (KENALOG) 10 MG/ML injection 10 mg  10 mg Other Once [place], Jia Dottavio, DPM      . triamcinolone acetonide (KENALOG) 10 MG/ML injection 10 mg  10 mg Other Once Landis Martins, DPM        No Known Allergies  Objective: There were no vitals filed for this visit.  General: No acute distress, AAOx3  Right foot: Incision well-healed, with mild hypertrophy and scar, minimal edema slightly increased from sandal strap today, no erythema, no warmth or redness or acute signs of infection, capillary fill time <3 seconds in all digits, gross sensation present via light touch to right foot.  No pain with range of motion right foot.  No pain with calf compression.   Xrays consistent with post op status  Assessment and Plan:  Problem List Items Addressed This Visit    None    Visit Diagnoses    S/P foot surgery, right    -  Primary   Retrocalcaneal  exostosis       Achilles tendinitis, right leg       Pain of right heel          -Patient seen and evaluated -Xrays reviewed  -Incision remains well healed, advised continue with scar creams -Encouraged continue with range of motion exercises -May increase activities to tolerance -Normal work and hours on 6/28, short term disability paperwork completed -Return to office in 4 weeks, for follow-up evaluation and final POV since patient will be going to [employer] Asencion Islam, DPM

## 2019-09-16 ENCOUNTER — Other Ambulatory Visit: Payer: Self-pay | Admitting: Sports Medicine

## 2019-09-16 DIAGNOSIS — Z9889 Other specified postprocedural states: Secondary | ICD-10-CM

## 2019-09-16 DIAGNOSIS — M7661 Achilles tendinitis, right leg: Secondary | ICD-10-CM

## 2019-09-17 ENCOUNTER — Encounter: Payer: BC Managed Care – PPO | Admitting: Sports Medicine

## 2019-09-25 DIAGNOSIS — Z01818 Encounter for other preprocedural examination: Secondary | ICD-10-CM | POA: Diagnosis not present

## 2019-09-28 DIAGNOSIS — D519 Vitamin B12 deficiency anemia, unspecified: Secondary | ICD-10-CM | POA: Diagnosis not present

## 2019-09-28 DIAGNOSIS — Z6839 Body mass index (BMI) 39.0-39.9, adult: Secondary | ICD-10-CM | POA: Diagnosis not present

## 2019-09-28 DIAGNOSIS — Z79899 Other long term (current) drug therapy: Secondary | ICD-10-CM | POA: Diagnosis not present

## 2019-10-04 NOTE — Progress Notes (Signed)
  Subjective:  Patient ID: Mercedes Kline, female    DOB: 1975/04/05,  MRN: [id number]  No chief complaint on file.   DOS: 06/28/19 Procedure: Haglund's resection with detachment reattachment Achilles tendon right  44 y.o. female presents with the above complaint. History confirmed with patient.   Objective:  Physical Exam: tenderness at the surgical site, local edema noted and calf supple, nontender. Incision: healing well, no significant drainage, no dehiscence, no significant erythema  Radiographs: X-ray of the right foot: consistent with postop state  Assessment:   1. S/P foot surgery, right   2. Achilles tendinitis, right leg     Plan:  Patient was evaluated and treated and all questions answered.  Post-operative State -XR reviewed with patient -Dressing applied consisting of sterile gauze, kerlix and ACE bandage -NWB with knee scooter  No follow-ups on file.

## 2019-10-08 DIAGNOSIS — Z6833 Body mass index (BMI) 33.0-33.9, adult: Secondary | ICD-10-CM | POA: Diagnosis not present

## 2019-10-08 DIAGNOSIS — J069 Acute upper respiratory infection, unspecified: Secondary | ICD-10-CM | POA: Diagnosis not present

## 2019-10-11 DIAGNOSIS — Z3009 Encounter for other general counseling and advice on contraception: Secondary | ICD-10-CM | POA: Diagnosis not present

## 2019-10-11 DIAGNOSIS — Z309 Encounter for contraceptive management, unspecified: Secondary | ICD-10-CM | POA: Diagnosis not present

## 2019-10-15 ENCOUNTER — Encounter: Payer: BC Managed Care – PPO | Admitting: Sports Medicine

## 2019-10-20 ENCOUNTER — Other Ambulatory Visit: Payer: Self-pay | Admitting: Sports Medicine

## 2019-10-20 DIAGNOSIS — Z9841 Cataract extraction status, right eye: Secondary | ICD-10-CM

## 2019-10-25 DIAGNOSIS — Z01818 Encounter for other preprocedural examination: Secondary | ICD-10-CM | POA: Diagnosis not present

## 2019-11-25 DIAGNOSIS — Z01818 Encounter for other preprocedural examination: Secondary | ICD-10-CM | POA: Diagnosis not present

## 2019-12-07 ENCOUNTER — Telehealth: Payer: Self-pay | Admitting: Sports Medicine

## 2019-12-07 NOTE — Telephone Encounter (Signed)
Pt req extension on handicap placard

## 2019-12-07 NOTE — Telephone Encounter (Signed)
We can extend her handicap for another 6 months (temp). She can come by office to pick up form to take the the [facility] Thanks Dr. Kathie Rhodes

## 2019-12-26 DIAGNOSIS — Z01818 Encounter for other preprocedural examination: Secondary | ICD-10-CM | POA: Diagnosis not present

## 2020-01-03 DIAGNOSIS — Z79899 Other long term (current) drug therapy: Secondary | ICD-10-CM | POA: Diagnosis not present

## 2020-01-03 DIAGNOSIS — D519 Vitamin B12 deficiency anemia, unspecified: Secondary | ICD-10-CM | POA: Diagnosis not present

## 2020-01-03 DIAGNOSIS — Z6839 Body mass index (BMI) 39.0-39.9, adult: Secondary | ICD-10-CM | POA: Diagnosis not present

## 2020-01-20 DIAGNOSIS — G4700 Insomnia, unspecified: Secondary | ICD-10-CM | POA: Diagnosis not present

## 2020-01-20 DIAGNOSIS — Z23 Encounter for immunization: Secondary | ICD-10-CM | POA: Diagnosis not present

## 2020-01-20 DIAGNOSIS — F419 Anxiety disorder, unspecified: Secondary | ICD-10-CM | POA: Diagnosis not present

## 2020-01-20 DIAGNOSIS — Z6834 Body mass index (BMI) 34.0-34.9, adult: Secondary | ICD-10-CM | POA: Diagnosis not present

## 2020-01-20 DIAGNOSIS — Z3009 Encounter for other general counseling and advice on contraception: Secondary | ICD-10-CM | POA: Diagnosis not present

## 2020-01-20 DIAGNOSIS — Z309 Encounter for contraceptive management, unspecified: Secondary | ICD-10-CM | POA: Diagnosis not present

## 2020-07-24 ENCOUNTER — Other Ambulatory Visit: Payer: Self-pay | Admitting: Nurse Practitioner

## 2020-07-24 DIAGNOSIS — Z1231 Encounter for screening mammogram for malignant neoplasm of breast: Secondary | ICD-10-CM

## 2021-04-18 ENCOUNTER — Encounter: Payer: Self-pay | Admitting: Gastroenterology

## 2021-05-16 ENCOUNTER — Other Ambulatory Visit: Payer: Self-pay

## 2021-05-16 DIAGNOSIS — Z800 Family history of malignant neoplasm of digestive organs: Secondary | ICD-10-CM | POA: Insufficient documentation

## 2021-05-16 DIAGNOSIS — G4700 Insomnia, unspecified: Secondary | ICD-10-CM

## 2021-05-27 NOTE — Progress Notes (Signed)
05/27/2021 ZAILEE VALLELY [id number] 1975/11/25   CHIEF COMPLAINT: Schedule a colonoscopy   HISTORY OF PRESENT ILLNESS: Alys B. Servais is a 46 year old female with a past medical history of anxiety and depression. S/P right and left Achilles tendon surgery.  She presents to our office today as referred by Dr. Burnell Blanks to discuss scheduling a screening colonoscopy. Her father was diagnosed with colon cancer in his early 57's and his twin sister also had colon cancer, age of her diagnosis is unclear.  She denies having any abdominal pain or change in bowel pattern.  She is passing normal formed brown bowel movement daily.  No rectal bleeding or black stools.  She has infrequent heartburn.  She is taking Phentermine intermittently in the past for weight loss but denies taking this medication recently.  She complains of having nausea with sedation/anesthesia.  She reported undergoing routine laboratory studies within the past year by her PCP which were normal.  No complaints at this time.  Social History: She is married.  She has 1 son.  She is a Occupational psychologist.  Non-smoker.  No alcohol use.  No drug use.  Family History: Father was diagnosed with colon cancer in his early 40s, he died at the age of 38 secondary to MI.  Paternal aunt (father's twin) with history of colon cancer. Mother deceased in her late 51's with history of pancreatic cancer, breast cancer and a brain aneurysm. Half brother healthy.   No Known Allergies   Outpatient Encounter Medications as of 05/28/2021  Medication Sig   ALPRAZolam (XANAX) 0.5 MG tablet Xanax  0.5 mg daily as needed.   aspirin EC 325 MG tablet Take 1 tablet (325 mg total) by mouth daily. To prevent against blood clot while nonweightbearing   docusate sodium (COLACE) 100 MG capsule Take 1 capsule (100 mg total) by mouth 2 (two) times daily.   escitalopram (LEXAPRO) 20 MG tablet Take 20 mg by mouth daily.   gabapentin (NEURONTIN)  300 MG capsule Take 1 capsule (300 mg total) by mouth at bedtime.   ibuprofen (ADVIL) 800 MG tablet Take 1 tablet (800 mg total) by mouth every 8 (eight) hours as needed.   medroxyPROGESTERone (DEPO-PROVERA) 150 MG/ML injection Inject 150 mg into the muscle every 3 (three) months.   phentermine (ADIPEX-P) 37.5 MG tablet TAKE 1 TABLET BY MOUTH EVERY MORNING FOR APPETTIE SUPPRESSION   promethazine (PHENERGAN) 25 MG tablet Take 1 tablet (25 mg total) by mouth every 8 (eight) hours as needed for nausea or vomiting.   zolpidem (AMBIEN) 10 MG tablet TAKE 1 TABLET BY MOUTH AT BEDTIME AS NEEDED FOR INSOMNIA   Facility-Administered Encounter Medications as of 05/28/2021  Medication   triamcinolone acetonide (KENALOG) 10 MG/ML injection 10 mg   triamcinolone acetonide (KENALOG) 10 MG/ML injection 10 mg   triamcinolone acetonide (KENALOG) 10 MG/ML injection 10 mg   triamcinolone acetonide (KENALOG) 10 MG/ML injection 10 mg   triamcinolone acetonide (KENALOG) 10 MG/ML injection 10 mg   REVIEW OF SYSTEMS:  Gen: Denies fever, sweats or chills. No weight loss.  CV: Denies chest pain, palpitations or edema. Resp: Denies cough, shortness of breath of hemoptysis.  GI: See HPI  GU : Denies urinary burning, blood in urine, increased urinary frequency or incontinence. MS: Denies joint pain, muscles aches or weakness. Derm: Denies rash, itchiness, skin lesions or unhealing ulcers. Psych: + Anxiety and depression. Heme: Denies bruising, easy bleeding. Neuro:  Denies headaches, dizziness or paresthesias.  Endo:  Denies any problems with DM, thyroid or adrenal function.  PHYSICAL EXAM: BP 134/90 (BP Location: Left Arm, Patient Position: Sitting, Cuff Size: Normal)    Pulse 80    Ht 5' 2.5" (1.588 m) Comment: height measured without shoes   Wt 192 lb 6 oz (87.3 kg)    BMI 34.63 kg/m   General: 46 year old female in no acute distress. Head: Normocephalic and atraumatic. Eyes:  Sclerae non-icteric, conjunctive  pink. Ears: Normal auditory acuity. Mouth: Dentition intact. No ulcers or lesions.  Neck: Supple, no lymphadenopathy or thyromegaly.  Lungs: Clear bilaterally to auscultation without wheezes, crackles or rhonchi. Heart: Regular rate and rhythm. No murmur, rub or gallop appreciated.  Abdomen: Soft, nontender, non distended. No masses. No hepatosplenomegaly. Normoactive bowel sounds x 4 quadrants.  Umbilical ring. Rectal: Deferred. Musculoskeletal: Symmetrical with no gross deformities. Skin: Warm and dry. No rash or lesions on visible extremities. Extremities: No edema. Neurological: Alert oriented x 4, no focal deficits.  Psychological:  Alert and cooperative. Normal mood and affect.  ASSESSMENT AND PLAN:  43) 46 year old female requiring a screening colonoscopy. Father diagnosed with colon cancer in his early 79s.  Paternal aunt with history of colon cancer.  -Colonoscopy benefits and risks discussed including risk with sedation, risk of bleeding, perforation and infection  -Patient informed not to take phentermine for at least 10 days prior to her colonoscopy date -Further recommendations to be determined after colonoscopy completed       CC:  Lonie Peak, PA-C

## 2021-05-28 ENCOUNTER — Ambulatory Visit: Payer: BC Managed Care – PPO | Admitting: Nurse Practitioner

## 2021-05-28 ENCOUNTER — Encounter: Payer: Self-pay | Admitting: Nurse Practitioner

## 2021-05-28 VITALS — BP 134/90 | HR 80 | Ht 62.5 in | Wt 192.4 lb

## 2021-05-28 DIAGNOSIS — Z800 Family history of malignant neoplasm of digestive organs: Secondary | ICD-10-CM | POA: Diagnosis not present

## 2021-05-28 DIAGNOSIS — Z1211 Encounter for screening for malignant neoplasm of colon: Secondary | ICD-10-CM | POA: Diagnosis not present

## 2021-05-28 NOTE — Patient Instructions (Signed)
You have been scheduled for a colonoscopy. Please follow the written instructions given to you at your visit today. If you use inhalers (even only as needed), please bring them with you on the day of your procedure.  Do not take Phentermine for 10 days prior to the colonoscopy.  BMI:  If you are age 45 or older, your body mass index should be between 23-30. Your Body mass index is 34.63 kg/m. If this is out of the aforementioned range listed, please consider follow up with your Primary Care Provider.  If you are age 59 or younger, your body mass index should be between 19-25. Your Body mass index is 34.63 kg/m. If this is out of the aformentioned range listed, please consider follow up with your Primary Care Provider.   MY CHART:  The [facility] providers would like to encourage you to use [facility] to communicate with providers for non-urgent requests or questions.  Due to long hold times on the telephone, sending your provider a message by [facility] may be a faster and more efficient way to get a response.  Please allow 48 business hours for a response.  Please remember that this is for non-urgent requests.   Thank you for trusting me with your gastrointestinal care!    Arnaldo Natal, CRNP

## 2021-05-29 NOTE — Progress Notes (Signed)
Reviewed and agree with management plans. ? ?Audrielle Vankuren L. Ladeana Laplant, MD, MPH  ?

## 2021-05-30 ENCOUNTER — Encounter: Payer: BLUE CROSS/BLUE SHIELD | Admitting: Gastroenterology

## 2021-06-05 ENCOUNTER — Encounter: Payer: Self-pay | Admitting: Gastroenterology

## 2021-06-08 ENCOUNTER — Ambulatory Visit (AMBULATORY_SURGERY_CENTER): Payer: BC Managed Care – PPO | Admitting: Gastroenterology

## 2021-06-08 ENCOUNTER — Other Ambulatory Visit: Payer: Self-pay

## 2021-06-08 ENCOUNTER — Encounter: Payer: Self-pay | Admitting: Gastroenterology

## 2021-06-08 VITALS — BP 144/75 | HR 59 | Temp 98.0°F | Resp 12 | Ht 62.5 in | Wt 192.0 lb

## 2021-06-08 DIAGNOSIS — Z800 Family history of malignant neoplasm of digestive organs: Secondary | ICD-10-CM | POA: Diagnosis present

## 2021-06-08 DIAGNOSIS — Z1211 Encounter for screening for malignant neoplasm of colon: Secondary | ICD-10-CM | POA: Diagnosis not present

## 2021-06-08 MED ORDER — SODIUM CHLORIDE 0.9 % IV SOLN: 500.0000 mL | Freq: Once | INTRAVENOUS | Status: DC

## 2021-06-08 NOTE — Patient Instructions (Signed)
Handout given on diverticulosis and a high fiber diet.  Drink plenty of water.  Consider adding a daily stool bulking agent such as Metamucil or Benefiber.  Continue present medications.    Repeat colonoscopy in 5 years for surveillance given family history, earlier with new symptoms.   ? ?YOU HAD AN ENDOSCOPIC PROCEDURE TODAY AT THE [facility]:   Refer to the procedure report that was given to you for any specific questions about what was found during the examination.  If the procedure report does not answer your questions, please call your gastroenterologist to clarify.  If you requested that your care partner not be given the details of your procedure findings, then the procedure report has been included in a sealed envelope for you to review at your convenience later. ? ?YOU SHOULD EXPECT: Some feelings of bloating in the abdomen. Passage of more gas than usual.  Walking can help get rid of the air that was put into your GI tract during the procedure and reduce the bloating. If you had a lower endoscopy (such as a colonoscopy or flexible sigmoidoscopy) you may notice spotting of blood in your stool or on the toilet paper. If you underwent a bowel prep for your procedure, you may not have a normal bowel movement for a few days. ? ?Please Note:  You might notice some irritation and congestion in your nose or some drainage.  This is from the oxygen used during your procedure.  There is no need for concern and it should clear up in a day or so. ? ?SYMPTOMS TO REPORT IMMEDIATELY: ? ?Following lower endoscopy (colonoscopy or flexible sigmoidoscopy): ? Excessive amounts of blood in the stool ? Significant tenderness or worsening of abdominal pains ? Swelling of the abdomen that is new, acute ? Fever of 100?F or higher ? ?For urgent or emergent issues, a gastroenterologist can be reached at any hour by calling [phone]. ?Do not use MyChart messaging for urgent concerns.  ? ? ?DIET:  We do recommend a  small meal at first, but then you may proceed to your regular diet.  Drink plenty of fluids but you should avoid alcoholic beverages for 24 hours. ? ?ACTIVITY:  You should plan to take it easy for the rest of today and you should NOT DRIVE or use heavy machinery until tomorrow (because of the sedation medicines used during the test).   ? ?FOLLOW UP: ?Our staff will call the number listed on your records 48-72 hours following your procedure to check on you and address any questions or concerns that you may have regarding the information given to you following your procedure. If we do not reach you, we will leave a message.  We will attempt to reach you two times.  During this call, we will ask if you have developed any symptoms of COVID 19. If you develop any symptoms (ie: fever, flu-like symptoms, shortness of breath, cough etc.) before then, please call [phone].  If you test positive for Covid 19 in the 2 weeks post procedure, please call and report this information to [place].   ? ?If any biopsies were taken you will be contacted by phone or by letter within the next 1-3 weeks.  Please call us at [phone] if you have not heard about the biopsies in 3 weeks.  ? ? ?SIGNATURES/CONFIDENTIALITY: ?You and/or your care partner have signed paperwork which will be entered into your electronic medical record.  These signatures attest to the fact that  that the information above on your After Visit Summary has been reviewed and is understood.  Full responsibility of the confidentiality of this discharge information lies with you and/or your care-partner.  ?

## 2021-06-08 NOTE — Op Note (Addendum)
[facility] ?Patient Name: Mercedes BattyRobin Kline ?Procedure Date: 06/08/2021 9:04 AM ?MRN: [id number] ?Endoscopist: Tressia DanasKimberly Ceira Hoeschen MD, MD ?Age: 46 ?Referring MD:  ?Date of Birth: 1975-06-06 ?Gender: Female ?Account #: [id number] ?Procedure:                Colonoscopy ?Indications:              Screening for colorectal malignant neoplasm, This  ?                          is the patient's first colonoscopy ?                          Father with colon cancer in his 3650s ?                          Paternal aunt (father's twin) with colon cancer at  ?                          an unknown age ?Medicines:                Monitored Anesthesia Care ?Procedure:                Pre-Anesthesia Assessment: ?                          - Prior to the procedure, a History and Physical  ?                          was performed, and patient medications and  ?                          allergies were reviewed. The patient's tolerance of  ?                          previous anesthesia was also reviewed. The risks  ?                          and benefits of the procedure and the sedation  ?                          options and risks were discussed with the patient.  ?                          All questions were answered, and informed consent  ?                          was obtained. Prior Anticoagulants: The patient has  ?                          taken no previous anticoagulant or antiplatelet  ?                          agents. ASA Grade Assessment: II - A patient with  ?  mild systemic disease. After reviewing the risks  ?                          and benefits, the patient was deemed in  ?                          satisfactory condition to undergo the procedure. ?                          After obtaining informed consent, the colonoscope  ?                          was passed under direct vision. Throughout the  ?                          procedure, the patient's blood pressure, pulse, and  ?                           oxygen saturations were monitored continuously. The  ?                          CF [id number] #[id number] was introduced through the anus  ?                          and advanced to the 3 cm into the ileum. A second  ?                          forward view of the right colon was performed. The  ?                          colonoscopy was performed without difficulty. The  ?                          patient tolerated the procedure well. The quality  ?                          of the bowel preparation was good. The terminal  ?                          ileum, ileocecal valve, appendiceal orifice, and  ?                          rectum were photographed. ?Scope In: 9:09:58 AM ?Scope Out: 9:23:37 AM ?Scope Withdrawal Time: 0 hours 11 minutes 26 seconds  ?Total Procedure Duration: 0 hours 13 minutes 39 seconds  ?Findings:                 The perianal and digital rectal examinations were  ?                          normal. ?                          A few small and large-mouthed diverticula were  ?  found in the sigmoid colon, descending colon and  ?                          ascending colon. ?                          The exam was otherwise without abnormality on  ?                          direct and retroflexion views. ?Complications:            No immediate complications. ?Estimated Blood Loss:     Estimated blood loss: none. ?Impression:               - Diverticulosis in the sigmoid colon, in the  ?                          descending colon and in the ascending colon. ?                          - The examination was otherwise normal on direct  ?                          and retroflexion views. ?                          - No specimens collected. ?Recommendation:           - Patient has a contact number available for  ?                          emergencies. The signs and symptoms of potential  ?                          delayed complications were discussed with the  ?                           patient. Return to normal activities tomorrow.  ?                          Written discharge instructions were provided to the  ?                          patient. ?                          - High fiber diet. Drink plenty of water. Consider  ?                          adding a daily stool bulking agent such as  ?                          Metamucil or Benefiber. ?                          - Continue present medications. ?                          -  Repeat colonoscopy in 5 years for surveillance  ?                          given the family history, earlier with new symptoms. ?                          - Emerging evidence supports eating a diet of  ?                          fruits, vegetables, grains, calcium, and yogurt  ?                          while reducing red meat and alcohol may reduce the  ?                          risk of colon cancer. ?                          - Thank you for allowing me to be involved in your  ?                          colon cancer prevention. ?Tressia Danas MD, MD ?06/08/2021 9:28:35 AM ?This report has been signed electronically. ?

## 2021-06-08 NOTE — Progress Notes (Signed)
Pt's states no medical or surgical changes since previsit or office visit. 

## 2021-06-08 NOTE — Progress Notes (Signed)
A and O x3. Report to RN. Tolerated MAC anesthesia well. 

## 2021-06-08 NOTE — Progress Notes (Signed)
Indication for colonoscopy: Family history of colon cancer ? ?See the office note from 05/28/21 for full details. There has been no change in history or physical exam. She remains an appropriate candidate for monitored anesthesia care in the [facility].  ?

## 2021-06-12 ENCOUNTER — Telehealth: Payer: Self-pay

## 2021-06-12 ENCOUNTER — Telehealth: Payer: Self-pay | Admitting: *Deleted

## 2021-06-12 NOTE — Telephone Encounter (Signed)
?  Follow up Call- ? ?Call back number 06/08/2021  ?Post procedure Call Back phone  # [phone]  ?Permission to leave phone message Yes  ?Some recent data might be hidden  ?  ?LMOM to call back with any questions or concerns.  Also, call back if patient has developed fever, respiratory issues or been dx with COVID or had any family members or close contacts diagnosed since her procedure.  ?

## 2021-06-12 NOTE — Telephone Encounter (Signed)
?  Follow up Call- ? ?Call back number 06/08/2021  ?Post procedure Call Back phone  # [phone]  ?Permission to leave phone message Yes  ?Some recent data might be hidden  ?  ? ?Left message ?

## 2021-11-01 ENCOUNTER — Other Ambulatory Visit: Payer: Self-pay | Admitting: Family Medicine

## 2021-11-01 DIAGNOSIS — Z1231 Encounter for screening mammogram for malignant neoplasm of breast: Secondary | ICD-10-CM

## 2021-12-04 ENCOUNTER — Ambulatory Visit
Admission: RE | Admit: 2021-12-04 | Discharge: 2021-12-04 | Disposition: A | Discharge status: Home / Self Care | Payer: BC Managed Care – PPO | Source: Ambulatory Visit | Attending: Family Medicine | Admitting: Family Medicine

## 2021-12-04 DIAGNOSIS — Z1231 Encounter for screening mammogram for malignant neoplasm of breast: Secondary | ICD-10-CM

## 2021-12-06 ENCOUNTER — Other Ambulatory Visit: Payer: Self-pay | Admitting: Family Medicine

## 2021-12-06 DIAGNOSIS — R928 Other abnormal and inconclusive findings on diagnostic imaging of breast: Secondary | ICD-10-CM

## 2022-01-04 ENCOUNTER — Other Ambulatory Visit: Payer: Self-pay | Admitting: Family Medicine

## 2022-01-04 ENCOUNTER — Ambulatory Visit
Admission: RE | Admit: 2022-01-04 | Discharge: 2022-01-04 | Disposition: A | Discharge status: Home / Self Care | Payer: BC Managed Care – PPO | Source: Ambulatory Visit | Attending: Family Medicine | Admitting: Family Medicine

## 2022-01-04 DIAGNOSIS — N6320 Unspecified lump in the left breast, unspecified quadrant: Secondary | ICD-10-CM

## 2022-01-04 DIAGNOSIS — R928 Other abnormal and inconclusive findings on diagnostic imaging of breast: Secondary | ICD-10-CM

## 2022-01-15 ENCOUNTER — Other Ambulatory Visit: Payer: BC Managed Care – PPO

## 2022-01-21 ENCOUNTER — Ambulatory Visit
Admission: RE | Admit: 2022-01-21 | Discharge: 2022-01-21 | Disposition: A | Discharge status: Home / Self Care | Payer: BC Managed Care – PPO | Source: Ambulatory Visit | Attending: Family Medicine | Admitting: Family Medicine

## 2022-01-21 DIAGNOSIS — N6320 Unspecified lump in the left breast, unspecified quadrant: Secondary | ICD-10-CM

## 2024-01-09 ENCOUNTER — Other Ambulatory Visit: Payer: Self-pay | Admitting: Family Medicine

## 2024-01-09 ENCOUNTER — Encounter: Payer: Self-pay | Admitting: Family Medicine

## 2024-01-09 DIAGNOSIS — Z1231 Encounter for screening mammogram for malignant neoplasm of breast: Secondary | ICD-10-CM

## 2024-03-01 ENCOUNTER — Other Ambulatory Visit: Payer: Self-pay | Admitting: Family Medicine

## 2024-03-01 ENCOUNTER — Ambulatory Visit
Admission: RE | Admit: 2024-03-01 | Discharge: 2024-03-01 | Disposition: A | Source: Ambulatory Visit | Attending: Family Medicine | Admitting: Family Medicine

## 2024-03-01 DIAGNOSIS — N6322 Unspecified lump in the left breast, upper inner quadrant: Secondary | ICD-10-CM

## 2024-03-01 DIAGNOSIS — Z1231 Encounter for screening mammogram for malignant neoplasm of breast: Secondary | ICD-10-CM

## 2024-03-01 DIAGNOSIS — N6324 Unspecified lump in the left breast, lower inner quadrant: Secondary | ICD-10-CM

## 2024-03-02 ENCOUNTER — Inpatient Hospital Stay: Admission: RE | Admit: 2024-03-02

## 2024-03-02 ENCOUNTER — Inpatient Hospital Stay: Admission: RE | Admit: 2024-03-02 | Discharge: 2024-03-02 | Attending: Family Medicine | Admitting: Family Medicine

## 2024-03-02 DIAGNOSIS — N6322 Unspecified lump in the left breast, upper inner quadrant: Secondary | ICD-10-CM
